# Patient Record
Sex: Female | Born: 1935 | Race: White | Hispanic: No | Smoking: Former smoker
Health system: Southern US, Community
[De-identification: ages and names within clinical notes are randomized; demographics above are authoritative.]

## PROBLEM LIST (undated history)

## (undated) DIAGNOSIS — T783XXA Angioneurotic edema, initial encounter: Secondary | ICD-10-CM

## (undated) DIAGNOSIS — M8580 Other specified disorders of bone density and structure, unspecified site: Secondary | ICD-10-CM

## (undated) DIAGNOSIS — I679 Cerebrovascular disease, unspecified: Secondary | ICD-10-CM

## (undated) DIAGNOSIS — I471 Supraventricular tachycardia: Secondary | ICD-10-CM

## (undated) DIAGNOSIS — M1990 Unspecified osteoarthritis, unspecified site: Secondary | ICD-10-CM

## (undated) DIAGNOSIS — I10 Essential (primary) hypertension: Secondary | ICD-10-CM

## (undated) DIAGNOSIS — F068 Other specified mental disorders due to known physiological condition: Secondary | ICD-10-CM

## (undated) DIAGNOSIS — R413 Other amnesia: Secondary | ICD-10-CM

## (undated) DIAGNOSIS — I4719 Other supraventricular tachycardia: Secondary | ICD-10-CM

## (undated) DIAGNOSIS — I639 Cerebral infarction, unspecified: Secondary | ICD-10-CM

## (undated) HISTORY — DX: Cerebral infarction, unspecified: I63.9

## (undated) HISTORY — DX: Angioneurotic edema, initial encounter: T78.3XXA

## (undated) HISTORY — DX: Other specified mental disorders due to known physiological condition: F06.8

## (undated) HISTORY — DX: Other supraventricular tachycardia: I47.19

## (undated) HISTORY — DX: Cerebrovascular disease, unspecified: I67.9

## (undated) HISTORY — DX: Other specified disorders of bone density and structure, unspecified site: M85.80

## (undated) HISTORY — DX: Essential (primary) hypertension: I10

## (undated) HISTORY — DX: Supraventricular tachycardia: I47.1

## (undated) HISTORY — DX: Unspecified osteoarthritis, unspecified site: M19.90

## (undated) HISTORY — DX: Other amnesia: R41.3

---

## 1960-02-02 HISTORY — PX: DILATION AND CURETTAGE OF UTERUS: SHX78

## 1961-02-01 HISTORY — PX: ABDOMINAL HYSTERECTOMY: SHX81

## 1984-02-02 HISTORY — PX: BUNIONECTOMY: SHX129

## 2002-09-25 ENCOUNTER — Other Ambulatory Visit: Admission: RE | Admit: 2002-09-25 | Discharge: 2002-09-25 | Payer: Self-pay | Admitting: Internal Medicine

## 2004-01-01 ENCOUNTER — Ambulatory Visit: Payer: Self-pay | Admitting: Internal Medicine

## 2004-04-06 ENCOUNTER — Ambulatory Visit: Payer: Self-pay | Admitting: Internal Medicine

## 2004-07-06 ENCOUNTER — Ambulatory Visit: Payer: Self-pay | Admitting: Internal Medicine

## 2004-07-17 ENCOUNTER — Ambulatory Visit: Payer: Self-pay

## 2004-11-10 ENCOUNTER — Ambulatory Visit: Payer: Self-pay | Admitting: Internal Medicine

## 2004-11-17 ENCOUNTER — Ambulatory Visit: Payer: Self-pay | Admitting: Internal Medicine

## 2004-11-18 ENCOUNTER — Ambulatory Visit: Payer: Self-pay | Admitting: Internal Medicine

## 2004-11-27 ENCOUNTER — Ambulatory Visit: Payer: Self-pay | Admitting: Family Medicine

## 2005-03-09 ENCOUNTER — Ambulatory Visit: Payer: Self-pay | Admitting: Internal Medicine

## 2005-03-17 ENCOUNTER — Ambulatory Visit: Payer: Self-pay

## 2005-03-19 ENCOUNTER — Ambulatory Visit: Payer: Self-pay | Admitting: Cardiovascular Disease

## 2005-03-24 ENCOUNTER — Ambulatory Visit: Payer: Self-pay | Admitting: Internal Medicine

## 2005-04-21 ENCOUNTER — Ambulatory Visit: Payer: Self-pay | Admitting: Internal Medicine

## 2005-07-21 ENCOUNTER — Ambulatory Visit: Payer: Self-pay | Admitting: Internal Medicine

## 2005-11-10 ENCOUNTER — Ambulatory Visit: Payer: Self-pay | Admitting: Internal Medicine

## 2005-11-10 ENCOUNTER — Encounter: Payer: Self-pay | Admitting: Internal Medicine

## 2005-11-10 DIAGNOSIS — M899 Disorder of bone, unspecified: Secondary | ICD-10-CM | POA: Insufficient documentation

## 2005-11-10 DIAGNOSIS — I10 Essential (primary) hypertension: Secondary | ICD-10-CM | POA: Insufficient documentation

## 2005-11-10 DIAGNOSIS — M949 Disorder of cartilage, unspecified: Secondary | ICD-10-CM

## 2005-11-24 ENCOUNTER — Encounter: Payer: Self-pay | Admitting: Cardiology

## 2005-11-24 ENCOUNTER — Ambulatory Visit: Payer: Self-pay

## 2005-12-03 ENCOUNTER — Ambulatory Visit: Payer: Self-pay | Admitting: Internal Medicine

## 2006-05-02 ENCOUNTER — Ambulatory Visit: Payer: Self-pay | Admitting: Internal Medicine

## 2006-05-10 ENCOUNTER — Ambulatory Visit: Payer: Self-pay | Admitting: Internal Medicine

## 2006-05-10 LAB — CONVERTED CEMR LAB
ALT: 21 units/L (ref 0–40)
AST: 24 units/L (ref 0–37)
Albumin: 3.8 g/dL (ref 3.5–5.2)
Alkaline Phosphatase: 60 units/L (ref 39–117)
BUN: 14 mg/dL (ref 6–23)
Basophils Absolute: 0.1 10*3/uL (ref 0.0–0.1)
Basophils Relative: 0.8 % (ref 0.0–1.0)
Bilirubin, Direct: 0.1 mg/dL (ref 0.0–0.3)
CO2: 33 meq/L — ABNORMAL HIGH (ref 19–32)
Calcium: 9.1 mg/dL (ref 8.4–10.5)
Chloride: 107 meq/L (ref 96–112)
Creatinine, Ser: 0.9 mg/dL (ref 0.4–1.2)
Eosinophils Absolute: 0.2 10*3/uL (ref 0.0–0.6)
Eosinophils Relative: 1.7 % (ref 0.0–5.0)
GFR calc Af Amer: 77 mL/min
GFR calc non Af Amer: 64 mL/min
Glucose, Bld: 102 mg/dL — ABNORMAL HIGH (ref 70–99)
HCT: 44.1 % (ref 36.0–46.0)
Hemoglobin: 15.1 g/dL — ABNORMAL HIGH (ref 12.0–15.0)
Lymphocytes Relative: 22.8 % (ref 12.0–46.0)
MCHC: 34.2 g/dL (ref 30.0–36.0)
MCV: 86.8 fL (ref 78.0–100.0)
Monocytes Absolute: 1.1 10*3/uL — ABNORMAL HIGH (ref 0.2–0.7)
Monocytes Relative: 12.5 % — ABNORMAL HIGH (ref 3.0–11.0)
Neutro Abs: 5.6 10*3/uL (ref 1.4–7.7)
Neutrophils Relative %: 62.2 % (ref 43.0–77.0)
Platelets: 218 10*3/uL (ref 150–400)
Potassium: 4.9 meq/L (ref 3.5–5.1)
RBC: 5.08 M/uL (ref 3.87–5.11)
RDW: 13.1 % (ref 11.5–14.6)
Sodium: 146 meq/L — ABNORMAL HIGH (ref 135–145)
TSH: 1.54 microintl units/mL (ref 0.35–5.50)
Total Bilirubin: 0.7 mg/dL (ref 0.3–1.2)
Total Protein: 6.8 g/dL (ref 6.0–8.3)
WBC: 9.1 10*3/uL (ref 4.5–10.5)

## 2006-08-11 DIAGNOSIS — I6529 Occlusion and stenosis of unspecified carotid artery: Secondary | ICD-10-CM

## 2006-10-11 ENCOUNTER — Ambulatory Visit: Payer: Self-pay | Admitting: Internal Medicine

## 2006-10-11 LAB — CONVERTED CEMR LAB: Rapid Strep: NEGATIVE

## 2006-10-31 ENCOUNTER — Ambulatory Visit: Payer: Self-pay | Admitting: Internal Medicine

## 2006-11-08 ENCOUNTER — Ambulatory Visit: Payer: Self-pay | Admitting: Internal Medicine

## 2006-11-08 LAB — CONVERTED CEMR LAB
ALT: 17 units/L (ref 0–35)
AST: 20 units/L (ref 0–37)
Albumin: 4 g/dL (ref 3.5–5.2)
Alkaline Phosphatase: 59 units/L (ref 39–117)
BUN: 14 mg/dL (ref 6–23)
Basophils Absolute: 0 10*3/uL (ref 0.0–0.1)
Basophils Relative: 0.1 % (ref 0.0–1.0)
Bilirubin Urine: NEGATIVE
Bilirubin, Direct: 0.2 mg/dL (ref 0.0–0.3)
CO2: 31 meq/L (ref 19–32)
Calcium: 9.2 mg/dL (ref 8.4–10.5)
Chloride: 104 meq/L (ref 96–112)
Cholesterol: 189 mg/dL (ref 0–200)
Creatinine, Ser: 0.9 mg/dL (ref 0.4–1.2)
Eosinophils Absolute: 0.3 10*3/uL (ref 0.0–0.6)
Eosinophils Relative: 3.7 % (ref 0.0–5.0)
GFR calc Af Amer: 77 mL/min
GFR calc non Af Amer: 64 mL/min
Glucose, Bld: 92 mg/dL (ref 70–99)
Glucose, Urine, Semiquant: NEGATIVE
HCT: 44.5 % (ref 36.0–46.0)
HDL: 64.7 mg/dL (ref >39.0)
Hemoglobin: 15.1 g/dL — ABNORMAL HIGH (ref 12.0–15.0)
Ketones, urine, test strip: NEGATIVE
LDL Cholesterol: 112 mg/dL — ABNORMAL HIGH (ref 0–99)
Lymphocytes Relative: 31 % (ref 12.0–46.0)
MCHC: 34 g/dL (ref 30.0–36.0)
MCV: 87.1 fL (ref 78.0–100.0)
Monocytes Absolute: 1 10*3/uL — ABNORMAL HIGH (ref 0.2–0.7)
Monocytes Relative: 12.2 % — ABNORMAL HIGH (ref 3.0–11.0)
Neutro Abs: 4.2 10*3/uL (ref 1.4–7.7)
Neutrophils Relative %: 53 % (ref 43.0–77.0)
Nitrite: NEGATIVE
Platelets: 219 10*3/uL (ref 150–400)
Potassium: 4.2 meq/L (ref 3.5–5.1)
RBC: 5.11 M/uL (ref 3.87–5.11)
RDW: 13.5 % (ref 11.5–14.6)
Sodium: 142 meq/L (ref 135–145)
Specific Gravity, Urine: 1.015
TSH: 1.5 microintl units/mL (ref 0.35–5.50)
Total Bilirubin: 1 mg/dL (ref 0.3–1.2)
Total CHOL/HDL Ratio: 2.9
Total Protein: 6.7 g/dL (ref 6.0–8.3)
Triglycerides: 61 mg/dL (ref 0–149)
Urobilinogen, UA: 0.2
VLDL: 12 mg/dL (ref 0–40)
WBC: 8 10*3/uL (ref 4.5–10.5)
pH: 7

## 2006-11-15 ENCOUNTER — Ambulatory Visit: Payer: Self-pay | Admitting: Internal Medicine

## 2006-11-22 ENCOUNTER — Telehealth: Payer: Self-pay | Admitting: Internal Medicine

## 2006-11-22 ENCOUNTER — Ambulatory Visit: Payer: Self-pay | Admitting: Internal Medicine

## 2006-12-26 ENCOUNTER — Ambulatory Visit: Payer: Self-pay | Admitting: Internal Medicine

## 2006-12-26 DIAGNOSIS — K589 Irritable bowel syndrome without diarrhea: Secondary | ICD-10-CM

## 2007-02-06 ENCOUNTER — Ambulatory Visit: Payer: Self-pay | Admitting: Internal Medicine

## 2007-02-07 ENCOUNTER — Telehealth: Payer: Self-pay | Admitting: Internal Medicine

## 2007-02-09 ENCOUNTER — Ambulatory Visit: Payer: Self-pay | Admitting: Internal Medicine

## 2007-02-20 ENCOUNTER — Ambulatory Visit: Payer: Self-pay | Admitting: Internal Medicine

## 2007-02-20 LAB — CONVERTED CEMR LAB
BUN: 8 mg/dL (ref 6–23)
Basophils Absolute: 0 10*3/uL (ref 0.0–0.1)
CO2: 29 meq/L (ref 19–32)
Chloride: 103 meq/L (ref 96–112)
Creatinine, Ser: 1.2 mg/dL (ref 0.4–1.2)
GFR calc Af Amer: 55 mL/min
Glucose, Bld: 112 mg/dL — ABNORMAL HIGH (ref 70–99)
HCT: 42.8 % (ref 36.0–46.0)
HDL: 31.4 mg/dL — ABNORMAL LOW (ref >39.0)
Hemoglobin: 14.6 g/dL (ref 12.0–15.0)
Lymphocytes Relative: 8.8 % — ABNORMAL LOW (ref 12.0–46.0)
Monocytes Absolute: 1.3 10*3/uL — ABNORMAL HIGH (ref 0.2–0.7)
Neutro Abs: 9.9 10*3/uL — ABNORMAL HIGH (ref 1.4–7.7)
Neutrophils Relative %: 79.9 % — ABNORMAL HIGH (ref 43.0–77.0)
Platelets: 313 10*3/uL (ref 150–400)
RBC: 4.95 M/uL (ref 3.87–5.11)
Sodium: 140 meq/L (ref 135–145)
Total CHOL/HDL Ratio: 4.6
Triglycerides: 66 mg/dL (ref 0–149)

## 2007-03-06 ENCOUNTER — Ambulatory Visit: Payer: Self-pay | Admitting: Internal Medicine

## 2007-03-21 ENCOUNTER — Ambulatory Visit: Payer: Self-pay | Admitting: Internal Medicine

## 2007-05-25 ENCOUNTER — Encounter: Payer: Self-pay | Admitting: Internal Medicine

## 2007-05-25 ENCOUNTER — Ambulatory Visit: Payer: Self-pay | Admitting: Internal Medicine

## 2007-05-25 LAB — CONVERTED CEMR LAB
ALT: 16 units/L (ref 0–35)
AST: 20 units/L (ref 0–37)
Albumin: 4 g/dL (ref 3.5–5.2)
Alkaline Phosphatase: 62 units/L (ref 39–117)
Basophils Relative: 0 % (ref 0.0–1.0)
Bilirubin, Direct: 0.1 mg/dL (ref 0.0–0.3)
Eosinophils Relative: 1.3 % (ref 0.0–5.0)
GFR calc non Af Amer: 57 mL/min
Glucose, Bld: 102 mg/dL — ABNORMAL HIGH (ref 70–99)
Hemoglobin: 15.5 g/dL — ABNORMAL HIGH (ref 12.0–15.0)
MCHC: 33.2 g/dL (ref 30.0–36.0)
MCV: 87.5 fL (ref 78.0–100.0)
Monocytes Absolute: 0.9 10*3/uL (ref 0.1–1.0)
Neutro Abs: 6.3 10*3/uL (ref 1.4–7.7)
Neutrophils Relative %: 71.7 % (ref 43.0–77.0)
Platelets: 198 10*3/uL (ref 150–400)
RBC: 5.34 M/uL — ABNORMAL HIGH (ref 3.87–5.11)
RDW: 14.1 % (ref 11.5–14.6)
Total Bilirubin: 1 mg/dL (ref 0.3–1.2)
Total CHOL/HDL Ratio: 3.1
Total Protein: 7.2 g/dL (ref 6.0–8.3)
Triglycerides: 83 mg/dL (ref 0–149)
WBC: 8.7 10*3/uL (ref 4.5–10.5)

## 2007-06-05 ENCOUNTER — Telehealth: Payer: Self-pay | Admitting: Internal Medicine

## 2007-06-06 ENCOUNTER — Ambulatory Visit: Payer: Self-pay | Admitting: Internal Medicine

## 2007-07-12 ENCOUNTER — Ambulatory Visit: Payer: Self-pay | Admitting: Internal Medicine

## 2007-07-21 ENCOUNTER — Ambulatory Visit: Payer: Self-pay | Admitting: Internal Medicine

## 2007-07-21 DIAGNOSIS — T783XXA Angioneurotic edema, initial encounter: Secondary | ICD-10-CM | POA: Insufficient documentation

## 2007-07-24 ENCOUNTER — Ambulatory Visit: Payer: Self-pay | Admitting: Internal Medicine

## 2007-07-28 ENCOUNTER — Ambulatory Visit: Payer: Self-pay | Admitting: Internal Medicine

## 2007-09-20 ENCOUNTER — Telehealth: Payer: Self-pay | Admitting: Internal Medicine

## 2007-09-22 ENCOUNTER — Ambulatory Visit: Payer: Self-pay | Admitting: Internal Medicine

## 2007-09-25 LAB — CONVERTED CEMR LAB
Basophils Absolute: 0 10*3/uL (ref 0.0–0.1)
Basophils Relative: 0 % (ref 0.0–3.0)
Eosinophils Absolute: 0.1 10*3/uL (ref 0.0–0.7)
Eosinophils Relative: 0.9 % (ref 0.0–5.0)
Lymphocytes Relative: 15.4 % (ref 12.0–46.0)
MCHC: 34.1 g/dL (ref 30.0–36.0)
Monocytes Absolute: 0.8 10*3/uL (ref 0.1–1.0)
Neutro Abs: 5.5 10*3/uL (ref 1.4–7.7)
WBC: 7.5 10*3/uL (ref 4.5–10.5)

## 2007-09-27 ENCOUNTER — Telehealth (INDEPENDENT_AMBULATORY_CARE_PROVIDER_SITE_OTHER): Payer: Self-pay | Admitting: *Deleted

## 2007-10-24 ENCOUNTER — Ambulatory Visit: Payer: Self-pay | Admitting: Gastroenterology

## 2007-10-25 ENCOUNTER — Encounter: Payer: Self-pay | Admitting: Gastroenterology

## 2007-10-25 ENCOUNTER — Ambulatory Visit: Payer: Self-pay | Admitting: Gastroenterology

## 2007-10-25 LAB — HM COLONOSCOPY

## 2007-10-27 ENCOUNTER — Encounter: Payer: Self-pay | Admitting: Gastroenterology

## 2007-11-08 ENCOUNTER — Ambulatory Visit (HOSPITAL_COMMUNITY): Admission: RE | Admit: 2007-11-08 | Discharge: 2007-11-08 | Payer: Self-pay | Admitting: Gastroenterology

## 2007-11-09 ENCOUNTER — Ambulatory Visit: Payer: Self-pay | Admitting: Internal Medicine

## 2007-12-05 ENCOUNTER — Ambulatory Visit: Payer: Self-pay | Admitting: Internal Medicine

## 2008-01-03 ENCOUNTER — Ambulatory Visit: Payer: Self-pay | Admitting: Internal Medicine

## 2008-01-03 LAB — CONVERTED CEMR LAB
BUN: 17 mg/dL (ref 6–23)
CO2: 29 meq/L (ref 19–32)
Calcium: 9.3 mg/dL (ref 8.4–10.5)
Chloride: 105 meq/L (ref 96–112)
Creatinine, Ser: 0.9 mg/dL (ref 0.4–1.2)
GFR calc Af Amer: 77 mL/min
GFR calc non Af Amer: 64 mL/min
Glucose, Bld: 98 mg/dL (ref 70–99)
Potassium: 3.6 meq/L (ref 3.5–5.1)
Sodium: 143 meq/L (ref 135–145)

## 2008-05-21 ENCOUNTER — Ambulatory Visit: Payer: Self-pay | Admitting: Internal Medicine

## 2008-05-29 ENCOUNTER — Telehealth (INDEPENDENT_AMBULATORY_CARE_PROVIDER_SITE_OTHER): Payer: Self-pay | Admitting: *Deleted

## 2008-06-18 ENCOUNTER — Ambulatory Visit: Payer: Self-pay | Admitting: Internal Medicine

## 2008-06-24 ENCOUNTER — Ambulatory Visit: Payer: Self-pay | Admitting: Internal Medicine

## 2008-06-27 ENCOUNTER — Telehealth: Payer: Self-pay | Admitting: Internal Medicine

## 2008-08-04 ENCOUNTER — Emergency Department (HOSPITAL_COMMUNITY): Admission: EM | Admit: 2008-08-04 | Discharge: 2008-08-04 | Payer: Self-pay | Admitting: Emergency Medicine

## 2008-08-06 ENCOUNTER — Ambulatory Visit: Payer: Self-pay | Admitting: Internal Medicine

## 2008-08-20 ENCOUNTER — Ambulatory Visit: Payer: Self-pay | Admitting: Internal Medicine

## 2008-08-23 ENCOUNTER — Ambulatory Visit: Payer: Self-pay | Admitting: Internal Medicine

## 2008-08-26 ENCOUNTER — Ambulatory Visit: Payer: Self-pay | Admitting: Internal Medicine

## 2008-09-10 ENCOUNTER — Ambulatory Visit: Payer: Self-pay | Admitting: Internal Medicine

## 2008-09-12 ENCOUNTER — Ambulatory Visit: Payer: Self-pay | Admitting: Internal Medicine

## 2008-09-13 ENCOUNTER — Encounter: Payer: Self-pay | Admitting: Internal Medicine

## 2008-09-13 LAB — CONVERTED CEMR LAB
AST: 18 units/L (ref 0–37)
Albumin: 3.9 g/dL (ref 3.5–5.2)
Basophils Absolute: 0 10*3/uL (ref 0.0–0.1)
Basophils Relative: 0 % (ref 0.0–3.0)
Eosinophils Relative: 0.3 % (ref 0.0–5.0)
Hemoglobin: 14.7 g/dL (ref 12.0–15.0)
Lymphocytes Relative: 11.1 % — ABNORMAL LOW (ref 12.0–46.0)
Monocytes Absolute: 1.1 10*3/uL — ABNORMAL HIGH (ref 0.1–1.0)
Monocytes Relative: 9.7 % (ref 3.0–12.0)
Neutro Abs: 8.7 10*3/uL — ABNORMAL HIGH (ref 1.4–7.7)
Neutrophils Relative %: 78.9 % — ABNORMAL HIGH (ref 43.0–77.0)
RDW: 13.1 % (ref 11.5–14.6)
Total Bilirubin: 1 mg/dL (ref 0.3–1.2)

## 2008-09-16 ENCOUNTER — Telehealth: Payer: Self-pay | Admitting: Internal Medicine

## 2008-09-26 ENCOUNTER — Ambulatory Visit: Payer: Self-pay | Admitting: Internal Medicine

## 2008-10-11 ENCOUNTER — Telehealth: Payer: Self-pay | Admitting: Internal Medicine

## 2008-10-14 ENCOUNTER — Encounter (INDEPENDENT_AMBULATORY_CARE_PROVIDER_SITE_OTHER): Payer: Self-pay | Admitting: *Deleted

## 2008-10-30 ENCOUNTER — Ambulatory Visit: Payer: Self-pay | Admitting: Internal Medicine

## 2008-11-12 ENCOUNTER — Observation Stay (HOSPITAL_COMMUNITY): Admission: EM | Admit: 2008-11-12 | Discharge: 2008-11-13 | Payer: Self-pay | Admitting: Emergency Medicine

## 2008-11-19 ENCOUNTER — Ambulatory Visit: Payer: Self-pay | Admitting: Internal Medicine

## 2008-11-25 ENCOUNTER — Telehealth: Payer: Self-pay | Admitting: Internal Medicine

## 2008-12-04 ENCOUNTER — Ambulatory Visit: Payer: Self-pay | Admitting: Internal Medicine

## 2008-12-30 ENCOUNTER — Telehealth: Payer: Self-pay | Admitting: Internal Medicine

## 2009-01-08 ENCOUNTER — Ambulatory Visit: Payer: Self-pay | Admitting: Internal Medicine

## 2009-02-19 ENCOUNTER — Ambulatory Visit: Payer: Self-pay | Admitting: Internal Medicine

## 2009-02-27 ENCOUNTER — Ambulatory Visit: Payer: Self-pay | Admitting: Internal Medicine

## 2009-03-19 ENCOUNTER — Ambulatory Visit: Payer: Self-pay | Admitting: Internal Medicine

## 2009-04-01 ENCOUNTER — Telehealth: Payer: Self-pay | Admitting: Internal Medicine

## 2009-04-02 ENCOUNTER — Ambulatory Visit: Payer: Self-pay | Admitting: Cardiology

## 2009-04-02 ENCOUNTER — Ambulatory Visit: Payer: Self-pay | Admitting: Internal Medicine

## 2009-04-02 DIAGNOSIS — I6350 Cerebral infarction due to unspecified occlusion or stenosis of unspecified cerebral artery: Secondary | ICD-10-CM | POA: Insufficient documentation

## 2009-04-03 ENCOUNTER — Telehealth: Payer: Self-pay | Admitting: Internal Medicine

## 2009-04-04 LAB — CONVERTED CEMR LAB
ALT: 15 units/L (ref 0–35)
Albumin: 3.8 g/dL (ref 3.5–5.2)
BUN: 11 mg/dL (ref 6–23)
Basophils Relative: 0.1 % (ref 0.0–3.0)
Bilirubin, Direct: 0 mg/dL (ref 0.0–0.3)
CO2: 33 meq/L — ABNORMAL HIGH (ref 19–32)
Calcium: 8.8 mg/dL (ref 8.4–10.5)
Chloride: 104 meq/L (ref 96–112)
Cholesterol: 163 mg/dL (ref 0–200)
Eosinophils Absolute: 0.1 10*3/uL (ref 0.0–0.7)
Eosinophils Relative: 0.8 % (ref 0.0–5.0)
Glucose, Bld: 94 mg/dL (ref 70–99)
HCT: 42.7 % (ref 36.0–46.0)
HDL: 57.9 mg/dL (ref >39.00)
Lymphocytes Relative: 15.6 % (ref 12.0–46.0)
Monocytes Absolute: 0.9 10*3/uL (ref 0.1–1.0)
Neutro Abs: 6.2 10*3/uL (ref 1.4–7.7)
Neutrophils Relative %: 72.6 % (ref 43.0–77.0)
RBC: 4.75 M/uL (ref 3.87–5.11)
RDW: 13.4 % (ref 11.5–14.6)
Total CHOL/HDL Ratio: 3
Triglycerides: 102 mg/dL (ref 0.0–149.0)
WBC: 8.5 10*3/uL (ref 4.5–10.5)

## 2009-04-07 ENCOUNTER — Ambulatory Visit: Payer: Self-pay | Admitting: Internal Medicine

## 2009-04-08 ENCOUNTER — Encounter: Admission: RE | Admit: 2009-04-08 | Discharge: 2009-04-08 | Payer: Self-pay | Admitting: Internal Medicine

## 2009-04-08 ENCOUNTER — Telehealth: Payer: Self-pay | Admitting: Internal Medicine

## 2009-04-11 ENCOUNTER — Encounter: Admission: RE | Admit: 2009-04-11 | Discharge: 2009-04-14 | Payer: Self-pay | Admitting: Internal Medicine

## 2009-04-18 ENCOUNTER — Ambulatory Visit: Payer: Self-pay | Admitting: Internal Medicine

## 2009-04-18 ENCOUNTER — Telehealth: Payer: Self-pay | Admitting: Internal Medicine

## 2009-04-23 ENCOUNTER — Ambulatory Visit: Payer: Self-pay | Admitting: Family Medicine

## 2009-04-24 ENCOUNTER — Telehealth: Payer: Self-pay | Admitting: *Deleted

## 2009-04-25 ENCOUNTER — Encounter: Payer: Self-pay | Admitting: Internal Medicine

## 2009-04-29 ENCOUNTER — Ambulatory Visit: Payer: Self-pay | Admitting: Internal Medicine

## 2009-05-07 ENCOUNTER — Ambulatory Visit: Payer: Self-pay | Admitting: Family Medicine

## 2009-05-07 DIAGNOSIS — F411 Generalized anxiety disorder: Secondary | ICD-10-CM | POA: Insufficient documentation

## 2009-05-12 ENCOUNTER — Ambulatory Visit: Payer: Self-pay | Admitting: Internal Medicine

## 2009-06-10 ENCOUNTER — Ambulatory Visit: Payer: Self-pay | Admitting: Internal Medicine

## 2009-06-11 ENCOUNTER — Telehealth: Payer: Self-pay | Admitting: Internal Medicine

## 2009-06-17 ENCOUNTER — Telehealth: Payer: Self-pay | Admitting: Internal Medicine

## 2009-06-23 ENCOUNTER — Ambulatory Visit: Payer: Self-pay | Admitting: Internal Medicine

## 2009-08-13 ENCOUNTER — Ambulatory Visit: Payer: Self-pay | Admitting: Internal Medicine

## 2009-08-27 ENCOUNTER — Ambulatory Visit: Payer: Self-pay | Admitting: Internal Medicine

## 2009-10-01 ENCOUNTER — Ambulatory Visit: Payer: Self-pay | Admitting: Internal Medicine

## 2009-11-10 ENCOUNTER — Ambulatory Visit: Payer: Self-pay | Admitting: Internal Medicine

## 2009-11-19 ENCOUNTER — Ambulatory Visit: Payer: Self-pay | Admitting: Internal Medicine

## 2009-12-24 ENCOUNTER — Ambulatory Visit: Payer: Self-pay | Admitting: Internal Medicine

## 2010-01-27 ENCOUNTER — Ambulatory Visit: Payer: Self-pay | Admitting: Internal Medicine

## 2010-03-03 ENCOUNTER — Ambulatory Visit
Admission: RE | Admit: 2010-03-03 | Discharge: 2010-03-03 | Payer: Self-pay | Source: Home / Self Care | Attending: Internal Medicine | Admitting: Internal Medicine

## 2010-03-03 NOTE — Assessment & Plan Note (Signed)
Summary: R EAR CONGESTED? // RS   Vital Signs:  Patient profile:   77 year old female Temp:     98.2 degrees F oral Pulse rate:   68 / minute Resp:     12 per minute BP sitting:   162 / 78  (left arm) Cuff size:   regular  Vitals Entered By: Gladis Riffle, RN (March 19, 2009 11:27 AM)  Serial Vital Signs/Assessments:  Time      Position  BP       Pulse  Resp  Temp     By                     142/80                         Birdie Sons MD  CC: c/o right ear clogged x 1 week, resolved and then got worse Is Patient Diabetic? No   Primary Care Provider:  Birdie Sons, M.D.  CC:  c/o right ear clogged x 1 week and resolved and then got worse.  History of Present Illness: right ear feels clogged---a pressure sensation no pain duration: 1 week no trauma decreased hearing  HTN---tolerating meds angioedema--no recurrence  All other systems reviewed and were negative   Preventive Screening-Counseling & Management  Alcohol-Tobacco     Smoking Status: quit > 6 months     Year Started: 1952     Year Quit: 1954  Medications Prior to Update: 1)  Aspir-Low 81 Mg Tbec (Aspirin) .... One By Mouth Daily 2)  Tums 500 Mg Chew (Calcium Carbonate Antacid) .... One Once Daily 3)  Lomotil 2.5-0.025 Mg Tabs (Diphenoxylate-Atropine) .... Once Daily As Needed 4)  Centrum Silver   Tabs (Multiple Vitamins-Minerals) .... Once Daily 5)  Benadryl 25 Mg  Caps (Diphenhydramine Hcl) .... Take 1 Tablet By Mouth At Bedtime--If Off Xyzal Can Take Every 4-6 Hours As Needed 6)  Clonidine Hcl 0.2 Mg Tabs (Clonidine Hcl) .... 1/2 By Mouth Three Times A Day 7)  Triamcinolone Acetonide 0.1 % Crea (Triamcinolone Acetonide) .... Use Twice Daily As Needed For Itching 8)  Xyzal 5 Mg Tabs (Levocetirizine Dihydrochloride) .... 1/2-1 Evry Hs As Needed 9)  Epipen 2-Pak 0.3 Mg/0.6ml Devi (Epinephrine) .... Use As Directed As Needed 10)  Hydrochlorothiazide 25 Mg  Tabs (Hydrochlorothiazide) .... Take 1/2 Tab By  Mouth Every Morning As Needed 11)  Diltiazem Hcl Cr 180 Mg Cp24 (Diltiazem Hcl) .... Take 1  Tablet By Mouth Two Times A Day 12)  Avapro 150 Mg Tabs (Irbesartan) .... Take 1 Tablet By Mouth Two Times A Day  Allergies: 1)  ! Ace Inhibitors 2)  ! Felodipine (Felodipine) 3)  ! Famotidine (Famotidine) 4)  ! Fexofenadine Hcl (Fexofenadine Hcl) 5)  ! Metoprolol Tartrate (Metoprolol Tartrate) 6)  Penicillin V Potassium (Penicillin V Potassium) 7)  Pneumovax 23 (Pneumococcal Vac Polyvalent) 8)  Prinivil (Lisinopril)  Past History:  Past Medical History: Last updated: 10/24/2007 Hypertension Osteopenia Carotid vascular disease (stenosis) angioedema-ACE inhibitor allergic reaction to pneumovax 10/03 Obesity Arthritis  Past Surgical History: Last updated: 10/20/2007 Hysterectomy (no ca) Oophorectomy-unilateral-1963 Bunionectomy-1986 D&C-1962 [id number]  Family History: Last updated: 10/24/2007 Family History of Heart Disease: Father, Mother No FH of Colon Cancer:  Social History: Last updated: 10/24/2007 Retired Married Former Smoker Alcohol Use - yes Daily Caffeine Use  Risk Factors: Smoking Status: quit > 6 months (03/19/2009)  Review of Systems       All  other systems reviewed and were negative   Physical Exam  General:  Well-developed,well-nourished,in no acute distress; alert,appropriate and cooperative throughout examination Head:  normocephalic and atraumatic.   Eyes:  pupils equal and pupils round.   Ears:  R ear normal and L ear normal.  left ear canal is tender to palpation. Neck:  No deformities, masses, or tenderness noted. Chest Wall:  Symmetrical;  no deformities or tenderness. Lungs:  Normal respiratory effort, chest expands symmetrically. Lungs are clear to auscultation, no crackles or wheezes. Heart:  normal rate and regular rhythm.     Impression & Recommendations:  Problem # 1:  OTITIS EXTERNA, UNSPEC. (ICD-380.10)  i suspect bilateral has  some eczematous changes both ears trial cortisporin otic side effects discussed.  Problem # 2:  HYPERTENSION (ICD-401.9)  continue current medications  Her updated medication list for this problem includes:    Clonidine Hcl 0.2 Mg Tabs (Clonidine hcl) .Marland Kitchen... 1/2 by mouth three times a day    Hydrochlorothiazide 25 Mg Tabs (Hydrochlorothiazide) .Marland Kitchen... Take 1/2 tab by mouth every morning as needed    Diltiazem Hcl Cr 180 Mg Cp24 (Diltiazem hcl) .Marland Kitchen... Take 1  tablet by mouth two times a day    Avapro 150 Mg Tabs (Irbesartan) .Marland Kitchen... Take 1 tablet by mouth two times a day  BP today: 162/78 Prior BP: 168/76 (02/27/2009)  Labs Reviewed: K+: 3.6 (01/03/2008) Creat: : 0.9 (01/03/2008)   Chol: 177 (05/25/2007)   HDL: 57.8 (05/25/2007)   LDL: 103 (05/25/2007)   TG: 83 (05/25/2007)  Problem # 3:  ANGIOEDEMA (ICD-995.1) no recurrence.  Complete Medication List: 1)  Aspir-low 81 Mg Tbec (Aspirin) .... One by mouth daily 2)  Tums 500 Mg Chew (Calcium carbonate antacid) .... One once daily 3)  Lomotil 2.5-0.025 Mg Tabs (Diphenoxylate-atropine) .... Once daily as needed 4)  Centrum Silver Tabs (Multiple vitamins-minerals) .... Once daily 5)  Benadryl 25 Mg Caps (Diphenhydramine hcl) .... Take 1 tablet by mouth at bedtime--if off xyzal can take every 4-6 hours as needed 6)  Clonidine Hcl 0.2 Mg Tabs (Clonidine hcl) .... 1/2 by mouth three times a day 7)  Triamcinolone Acetonide 0.1 % Crea (Triamcinolone acetonide) .... Use twice daily as needed for itching 8)  Xyzal 5 Mg Tabs (Levocetirizine dihydrochloride) .... 1/2-1 evry hs as needed 9)  Epipen 2-pak 0.3 Mg/0.85ml Devi (Epinephrine) .... Use as directed as needed 10)  Hydrochlorothiazide 25 Mg Tabs (Hydrochlorothiazide) .... Take 1/2 tab by mouth every morning as needed 11)  Diltiazem Hcl Cr 180 Mg Cp24 (Diltiazem hcl) .... Take 1  tablet by mouth two times a day 12)  Avapro 150 Mg Tabs (Irbesartan) .... Take 1 tablet by mouth two times a  day  Patient Instructions: 1)  see me 3-4 weeks 2)  cancel other appts Prescriptions: NEOMYCIN-POLYMYXIN-HC 3.5-10000-1 SOLN (NEOMYCIN-POLYMYXIN-HC) 2 drops each ear three times a day for 7 days  #1 vial x 0   Entered and Authorized by:   Birdie Sons MD   Signed by:   Birdie Sons MD on 03/19/2009   Method used:   Electronically to        [facility]* (retail)       [address]       [place], [place]  [address]       Ph: [phone]       Fax: [phone]   RxID:   [id number]

## 2010-03-03 NOTE — Miscellaneous (Signed)
Summary: Initial Summary for OT Services/[place]  Initial Summary for OT Services/   Imported By: Maryln Gottron 04/29/2009 13:21:49  _____________________________________________________________________  External Attachment:    Type:   Image     Comment:   External Document

## 2010-03-03 NOTE — Progress Notes (Signed)
Summary: REFILL  Phone Note Refill Request Message from:  Fax from Pharmacy  Refills Requested: Medication #1:  HYDROCHLOROTHIAZIDE 25 MG  TABS Take 1/2 tab by mouth every morning as needed [facility]---[phone]    FAX---[phone]  Initial call taken by: Warnell Forester,  April 24, 2009 12:45 PM    Prescriptions: HYDROCHLOROTHIAZIDE 25 MG  TABS (HYDROCHLOROTHIAZIDE) Take 1/2 tab by mouth every morning as needed  #30 x 3   Entered by:   Kern Reap CMA (AAMA)   Authorized by:   Birdie Sons MD   Signed by:   Kern Reap CMA (AAMA) on 04/24/2009   Method used:   Electronically to        [facility]* (retail)       [address]       [place], [place]  [id number]       Ph: [phone]       Fax: [phone]   RxID:   [id number]

## 2010-03-03 NOTE — Assessment & Plan Note (Signed)
Summary: DIARRHEA // RS   Vital Signs:  Patient profile:   77 year old female Weight:      149 pounds Temp:     98.7 degrees F oral Pulse rate:   50 / minute Pulse rhythm:   regular Resp:     12 per minute BP sitting:   164 / 76  (left arm)  Vitals Entered By: Gladis Riffle, RN (May 07, 2009 11:25 AM) CC: diarrhea x 1 1/2 months, resolved with lomotil--c/o feeling of "insides are going to come out" Is Patient Diabetic? No   History of Present Illness: Here with her husband for 3 months of symptoms including diarrhea, abdominal bloating, heart racing, mild SOB, and shakiness. She admits to being a nervous person, and she worries about things. Her husband thinks she worries too much. No chest pain.   Preventive Screening-Counseling & Management  Alcohol-Tobacco     Smoking Status: never     Year Started: 1950     Year Quit: 1954  Current Medications (verified): 1)  Aggrenox 25-200 Mg  Cp12 (Aspirin-Dipyridamole) .... Take 1 Capsule By Mouth Twice A Day 2)  Tums 500 Mg Chew (Calcium Carbonate Antacid) .... One Once Daily 3)  Lomotil 2.5-0.025 Mg Tabs (Diphenoxylate-Atropine) .... Once Daily As Needed 4)  Centrum Silver   Tabs (Multiple Vitamins-Minerals) .... Once Daily 5)  Benadryl 25 Mg  Caps (Diphenhydramine Hcl) .... Take 1 Tablet By Mouth At Bedtime--If Off Xyzal Can Take Every 4-6 Hours As Needed 6)  Clonidine Hcl 0.2 Mg Tabs (Clonidine Hcl) .... 1/2 Three Times Daily. 7)  Epipen 2-Pak 0.3 Mg/0.33ml Devi (Epinephrine) .... Use As Directed As Needed 8)  Hydrochlorothiazide 25 Mg  Tabs (Hydrochlorothiazide) .... Take 1/2 Tab By Mouth Every Morning As Needed 9)  Diltiazem Hcl Cr 180 Mg Cp24 (Diltiazem Hcl) .... Take 1  Tablet By Mouth Two Times A Day 10)  Avapro 150 Mg Tabs (Irbesartan) .... Take 1 Tablet By Mouth Two Times A Day 11)  Bystolic 10 Mg  Tabs (Nebivolol Hcl) .Marland Kitchen.. 1 By Mouth Daily  Allergies: 1)  ! Ace Inhibitors 2)  ! Felodipine (Felodipine) 3)  ! Famotidine  (Famotidine) 4)  ! Fexofenadine Hcl (Fexofenadine Hcl) 5)  ! Metoprolol Tartrate (Metoprolol Tartrate) 6)  Penicillin V Potassium (Penicillin V Potassium) 7)  Pneumovax 23 (Pneumococcal Vac Polyvalent) 8)  Prinivil (Lisinopril)  Past History:  Past Medical History: Reviewed history from 04/18/2009 and no changes required. Hypertension Osteopenia Carotid vascular disease (stenosis) angioedema-ACE inhibitor allergic reaction to pneumovax 10/03 Obesity Arthritis  cerebrovascular disease with right brain stroke/left hemiparesis  Past Surgical History: Reviewed history from 10/20/2007 and no changes required. Hysterectomy (no ca) Oophorectomy-unilateral-1963 Bunionectomy-1986 D&C-1962 [id number]  Review of Systems  The patient denies anorexia, fever, weight loss, weight gain, vision loss, decreased hearing, hoarseness, chest pain, syncope, peripheral edema, prolonged cough, headaches, hemoptysis, melena, hematochezia, severe indigestion/heartburn, hematuria, incontinence, genital sores, muscle weakness, suspicious skin lesions, transient blindness, difficulty walking, depression, unusual weight change, abnormal bleeding, enlarged lymph nodes, angioedema, breast masses, and testicular masses.    Physical Exam  General:  Well-developed,well-nourished,in no acute distress; alert,appropriate and cooperative throughout examination Neck:  No deformities, masses, or tenderness noted. Lungs:  Normal respiratory effort, chest expands symmetrically. Lungs are clear to auscultation, no crackles or wheezes. Heart:  Normal rate and regular rhythm. S1 and S2 normal without gallop, murmur, click, rub or other extra sounds. Abdomen:  Bowel sounds positive,abdomen soft and non-tender without masses, organomegaly or hernias noted.  Psych:  Oriented X3, memory intact for recent and remote, normally interactive, good eye contact, and moderately anxious.     Impression & Recommendations:  Problem # 1:   ANXIETY STATE, UNSPECIFIED (ICD-300.00)  Her updated medication list for this problem includes:    Ativan 0.5 Mg Tabs (Lorazepam) .Marland Kitchen... 1 q 6 hours as needed anxiety  Complete Medication List: 1)  Aggrenox 25-200 Mg Cp12 (Aspirin-dipyridamole) .... Take 1 capsule by mouth twice a day 2)  Tums 500 Mg Chew (Calcium carbonate antacid) .... One once daily 3)  Lomotil 2.5-0.025 Mg Tabs (Diphenoxylate-atropine) .... Once daily as needed 4)  Centrum Silver Tabs (Multiple vitamins-minerals) .... Once daily 5)  Benadryl 25 Mg Caps (Diphenhydramine hcl) .... Take 1 tablet by mouth at bedtime--if off xyzal can take every 4-6 hours as needed 6)  Clonidine Hcl 0.2 Mg Tabs (Clonidine hcl) .... 1/2 three times daily. 7)  Epipen 2-pak 0.3 Mg/0.89ml Devi (Epinephrine) .... Use as directed as needed 8)  Hydrochlorothiazide 25 Mg Tabs (Hydrochlorothiazide) .... Take 1/2 tab by mouth every morning as needed 9)  Diltiazem Hcl Cr 180 Mg Cp24 (Diltiazem hcl) .... Take 1  tablet by mouth two times a day 10)  Avapro 150 Mg Tabs (Irbesartan) .... Take 1 tablet by mouth two times a day 11)  Bystolic 10 Mg Tabs (Nebivolol hcl) .Marland Kitchen.. 1 by mouth daily 12)  Ativan 0.5 Mg Tabs (Lorazepam) .Marland Kitchen.. 1 q 6 hours as needed anxiety  Patient Instructions: 1)  I think most all of her symptoms stem from her anxiety, and they both agreed. Try Lorazepam on an as needed basis. She will follow up with Dr. Cato Mulligan next week. Prescriptions: ATIVAN 0.5 MG TABS (LORAZEPAM) 1 q 6 hours as needed anxiety  #60 x 2   Entered and Authorized by:   Nelwyn Salisbury MD   Signed by:   Nelwyn Salisbury MD on 05/07/2009   Method used:   Print then Give to Patient   RxID:   [id number]

## 2010-03-03 NOTE — Assessment & Plan Note (Signed)
Summary: FU ON MED/DIZZINESS/NJR   Vital Signs:  Patient profile:   77 year old female Weight:      150 pounds Temp:     98.6 degrees F oral Pulse rate:   48 / minute Pulse rhythm:   irregular Resp:     12 per minute BP sitting:   146 / 58  (left arm) Cuff size:   regular  Vitals Entered By: Gladis Riffle, RN (Jun 23, 2009 11:02 AM) CC: FU meds--states aggrenox causing blurred vision and dizziness, is back to one bid but has not taken this AM Is Patient Diabetic? No Comments c/o feeling slow and not herself BP 111/56-208/70 at home   Primary Care Provider:  Birdie Sons, M.D.  CC:  FU meds--states aggrenox causing blurred vision and dizziness and is back to one bid but has not taken this AM.  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 Ace Inhibitors 2)  ! Felodipine (Felodipine) 3)  ! Famotidine (Famotidine) 4)  ! Fexofenadine Hcl (Fexofenadine Hcl) 5)  ! Metoprolol Tartrate (Metoprolol Tartrate) 6)  Penicillin V Potassium (Penicillin V Potassium) 7)  Pneumovax 23 (Pneumococcal Vac Polyvalent) 8)  Prinivil (Lisinopril)  Past History:  Past Medical History: Last updated: 04/18/2009 Hypertension Osteopenia Carotid vascular disease (stenosis) angioedema-ACE inhibitor allergic reaction to  pneumovax 10/03 Obesity Arthritis  cerebrovascular disease with right brain stroke/left hemiparesis  Past Surgical History: Last updated: 10/20/2007 Hysterectomy (no ca) Oophorectomy-unilateral-1963 Bunionectomy-1986 D&C-1962 [id number]  Family History: Last updated: 10/24/2007 Family History of Heart Disease: Father, Mother No FH of Colon Cancer:  Social History: Last updated: 10/24/2007 Retired Married Former Smoker Alcohol Use - yes Daily Caffeine Use  Risk Factors: Smoking Status: never (06/23/2009)  Physical Exam  General:  alert and well-developed.   Head:  normocephalic and atraumatic.   Eyes:  pupils equal and pupils round.   Ears:  R ear normal and L ear normal.   Neck:  No deformities, masses, or tenderness noted. Chest Wall:  Symmetrical;  no deformities or tenderness. Lungs:  normal respiratory effort and no intercostal retractions.   Heart:  normal rate and regular rhythm.   Abdomen:  soft and non-tender.   Skin:  turgor normal and color normal.     Impression & Recommendations:  Problem # 1:  STROKE (ICD-434.91) dificulty tolerating aggrenox will d/c start asa 325 qd The following medications were removed from the medication list:    Aggrenox 25-200 Mg Cp12 (Aspirin-dipyridamole) .Marland Kitchen... Take 1 capsule by mouth twice a day Her updated medication list for this problem includes:    Aspirin 325 Mg Tabs (Aspirin) .Marland Kitchen... Take 1 tab by mouth every day  Complete Medication List: 1)  Tums 500 Mg Chew (Calcium carbonate antacid) .... One once daily 2)  Lomotil 2.5-0.025 Mg Tabs (Diphenoxylate-atropine) .... Once  daily as needed 3)  Centrum Silver Tabs (Multiple vitamins-minerals) .... Once daily 4)  Benadryl 25 Mg Caps (Diphenhydramine hcl) .... Take 1 tablet by mouth at bedtime--if off xyzal can take every 4-6 hours as needed 5)  Clonidine Hcl 0.2 Mg Tabs (Clonidine hcl) .... 1/2 three times daily. 6)  Epipen 2-pak 0.3 Mg/0.71ml Devi (Epinephrine) .... Use  as directed as needed 7)  Hydrochlorothiazide 25 Mg Tabs (Hydrochlorothiazide) .... Take 1/2 tab by mouth every morning as needed 8)  Diltiazem Hcl Cr 180 Mg Cp24 (Diltiazem hcl) .... Take 1  tablet by mouth two times a day 9)  Avapro 150 Mg Tabs (Irbesartan) .... Take 1 tablet by mouth two times a day 10)  Bystolic 20 Mg Tabs (Nebivolol hcl) .Marland Kitchen.. 1 by mouth daily 11)  Ativan 0.5 Mg Tabs (Lorazepam) .Marland Kitchen.. 1 q 6 hours as needed anxiety 12)  Aspirin 325 Mg Tabs (Aspirin) .... Take 1 tab by mouth every day  Patient Instructions: 1)  Please schedule a follow-up appointment in 1 month. 2)  cancel 7/12 appt

## 2010-03-03 NOTE — Assessment & Plan Note (Signed)
Summary: 1 month rov/njr/pt rsc/cjr/pt rescd per dr swords//ccm   Vital Signs:  Patient profile:   77 year old female Weight:      144.5 pounds Temp:     98.5 degrees F oral Pulse rate:   48 / minute Pulse rhythm:   irregular Resp:     12 per minute BP sitting:   150 / 64  (left arm) Cuff size:   regular  Vitals Entered By: Gladis Riffle, RN (August 13, 2009 11:35 AM) CC: 1 month rov, c/o no energy--BP 112/52-181/64 at home Is Patient Diabetic? No   Primary Care Provider:  Birdie Sons, M.D.  CC:  1 month rov and c/o no energy--BP 112/52-181/64 at home.  History of Present Illness: main complaint is no energy stroke---no recurrence HTN---home BPs 117-181/50-60s---average BP 140/50 bradycardia---note diltiazem, bystolic and clonidine  tolerating meds  Preventive Screening-Counseling & Management  Alcohol-Tobacco     Smoking Status: never     Year Started: 1950     Year Quit: 1954  Current Medications (verified): 1)  Tums 500 Mg Chew (Calcium Carbonate Antacid) .... One Once Daily 2)  Lomotil 2.5-0.025 Mg Tabs (Diphenoxylate-Atropine) .... Once Daily As Needed 3)  Centrum Silver   Tabs (Multiple Vitamins-Minerals) .... Once Daily 4)  Benadryl 25 Mg  Caps (Diphenhydramine Hcl) .... Take 1 Tablet By Mouth At Bedtime--If Off Xyzal Can Take Every 4-6 Hours As Needed 5)  Clonidine Hcl 0.2 Mg Tabs (Clonidine Hcl) .... 1/2 Three Times Daily. 6)  Epipen 2-Pak 0.3 Mg/0.47ml Devi (Epinephrine) .... Use As Directed As Needed 7)  Hydrochlorothiazide 25 Mg  Tabs (Hydrochlorothiazide) .... Take 1/2 Tab By Mouth Every Morning As Needed 8)  Diltiazem Hcl Cr 180 Mg Cp24 (Diltiazem Hcl) .... Take 1  Tablet By Mouth Two Times A Day 9)  Avapro 150 Mg Tabs (Irbesartan) .... Take 1 Tablet By Mouth Two Times A Day 10)  Bystolic 20 Mg Tabs (Nebivolol Hcl) .Marland Kitchen.. 1 By Mouth Daily 11)  Ativan 0.5 Mg Tabs (Lorazepam) .Marland Kitchen.. 1 Q 6 Hours As Needed Anxiety 12)  Aspirin 325 Mg Tabs (Aspirin) .... Take 1 Tab  By Mouth Every Day  Allergies: 1)  ! Ace Inhibitors 2)  ! Felodipine (Felodipine) 3)  ! Famotidine (Famotidine) 4)  ! Fexofenadine Hcl (Fexofenadine Hcl) 5)  ! Metoprolol Tartrate (Metoprolol Tartrate) 6)  Penicillin V Potassium (Penicillin V Potassium) 7)  Pneumovax 23 (Pneumococcal Vac Polyvalent) 8)  Prinivil (Lisinopril)  Physical Exam  General:  alert and well-developed.   Head:  normocephalic and atraumatic.   Eyes:  pupils equal and pupils round.   Neck:  No deformities, masses, or tenderness noted. Chest Wall:  Symmetrical;  no deformities or tenderness. Lungs:  normal respiratory effort and no intercostal retractions.   Heart:  normal rate and regular rhythm.   Abdomen:  soft and non-tender.   Msk:  No deformity or scoliosis noted of thoracic or lumbar spine.   Neurologic:  cranial nerves II-XII intact and gait normal.     Impression & Recommendations:  Problem # 1:  HYPERTENSION (ICD-401.9) reasonable control but she is quite bradycardic at home (pulse recorded at 39 one time) The following medications were removed from the medication list:    Clonidine Hcl 0.2 Mg Tabs (Clonidine hcl) .Marland Kitchen... 1/2 three times daily.---could be related to bradycardia in context of other meds Her updated medication list for this problem includes:    Hydrochlorothiazide 25 Mg Tabs (Hydrochlorothiazide) .Marland Kitchen... Take 1/2 tab by mouth every morning  as needed    Diltiazem Hcl Cr 180 Mg Cp24 (Diltiazem hcl) .Marland Kitchen... Take 1  tablet by mouth two times a day    Avapro 150 Mg Tabs (Irbesartan) .Marland Kitchen... Take 1 tablet by mouth two times a day    Bystolic 20 Mg Tabs (Nebivolol hcl) .Marland Kitchen... 1 by mouth daily    Hydralazine Hcl 25 Mg Tabs (Hydralazine hcl) .Marland Kitchen... 1 by mouth 3 times daily  Problem # 2:  STROKE (ICD-434.91) no recurrence Her updated medication list for this problem includes:    Aspirin 325 Mg Tabs (Aspirin) .Marland Kitchen... Take 1 tab by mouth every day  Complete Medication List: 1)  Tums 500 Mg Chew  (Calcium carbonate antacid) .... One once daily 2)  Lomotil 2.5-0.025 Mg Tabs (Diphenoxylate-atropine) .... Once daily as needed 3)  Centrum Silver Tabs (Multiple vitamins-minerals) .... Once daily 4)  Benadryl 25 Mg Caps (Diphenhydramine hcl) .... Take 1 tablet by mouth at bedtime--if off xyzal can take every 4-6 hours as needed 5)  Epipen 2-pak 0.3 Mg/0.60ml Devi (Epinephrine) .... Use as directed as needed 6)  Hydrochlorothiazide 25 Mg Tabs (Hydrochlorothiazide) .... Take 1/2 tab by mouth every morning as needed 7)  Diltiazem Hcl Cr 180 Mg Cp24 (Diltiazem hcl) .... Take 1  tablet by mouth two times a day 8)  Avapro 150 Mg Tabs (Irbesartan) .... Take 1 tablet by mouth two times a day 9)  Bystolic 20 Mg Tabs (Nebivolol hcl) .Marland Kitchen.. 1 by mouth daily 10)  Ativan 0.5 Mg Tabs (Lorazepam) .Marland Kitchen.. 1 q 6 hours as needed anxiety 11)  Aspirin 325 Mg Tabs (Aspirin) .... Take 1 tab by mouth every day 12)  Hydralazine Hcl 25 Mg Tabs (Hydralazine hcl) .Marland Kitchen.. 1 by mouth 3 times daily  Patient Instructions: 1)  see me 2-3 weeks Prescriptions: HYDRALAZINE HCL 25 MG  TABS (HYDRALAZINE HCL) 1 by mouth 3 times daily  #90 x 3   Entered and Authorized by:   Birdie Sons MD   Signed by:   Birdie Sons MD on 08/13/2009   Method used:   Electronically to        [facility]* (retail)       [address]       [place], [place]  [id number]       Ph: [phone]       Fax: [phone]   RxID:   [id number]

## 2010-03-03 NOTE — Assessment & Plan Note (Signed)
Summary: 1 wk rov/njr/pt rsc/cjr   Vital Signs:  Patient profile:   77 year old female Weight:      142 pounds Temp:     97.9 degrees F oral Pulse rate:   48 / minute Pulse rhythm:   regular Resp:     12 per minute BP sitting:   158 / 80  Vitals Entered By: Lynann Beaver CMA (November 19, 2009 10:36 AM) CC: rov Is Patient Diabetic? No Pain Assessment Patient in pain? no        Primary Care Provider:  Birdie Sons, M.D.  CC:  rov.  History of Present Illness:  Follow-Up Visit      This is an 77 year old woman who presents for Follow-up visit.  The patient denies chest pain and palpitations.  Since the last visit the patient notes no new problems or concerns.  The patient reports monitoring BP.  When questioned about possible medication side effects, the patient notes none.    All other systems reviewed and were negative   Current Problems (verified): 1)  Anxiety State, Unspecified  (ICD-300.00) 2)  Stroke  (ICD-434.91) 3)  Angioedema  (ICD-995.1) 4)  Ibs  (ICD-564.1) 5)  Occlusion, Carotid Artery w/o Infarction  (ICD-433.10) 6)  Osteopenia  (ICD-733.90) 7)  Hypertension  (ICD-401.9)  Current Medications (verified): 1)  Tums 500 Mg Chew (Calcium Carbonate Antacid) .... One Once Daily 2)  Lomotil 2.5-0.025 Mg Tabs (Diphenoxylate-Atropine) .... Once Daily As Needed 3)  Centrum Silver   Tabs (Multiple Vitamins-Minerals) .... Once Daily 4)  Benadryl 25 Mg  Caps (Diphenhydramine Hcl) .... Take 1 Tablet By Mouth At Bedtime--If Off Xyzal Can Take Every 4-6 Hours As Needed 5)  Epipen 2-Pak 0.3 Mg/0.48ml Devi (Epinephrine) .... Use As Directed As Needed 6)  Hydrochlorothiazide 25 Mg  Tabs (Hydrochlorothiazide) .... Take 1/2 Tab By Mouth Every Morning As Needed 7)  Diltiazem Hcl Cr 180 Mg Cp24 (Diltiazem Hcl) .... Take 1  Tablet By Mouth Once Daily 8)  Bystolic 20 Mg Tabs (Nebivolol Hcl) .Marland Kitchen.. 1 By Mouth Daily 9)  Ativan 0.5 Mg Tabs (Lorazepam) .Marland Kitchen.. 1 Q 6 Hours As Needed  Anxiety 10)  Aspirin 325 Mg Tabs (Aspirin) .... Take 1 Tab By Mouth Every Day 11)  Benicar 40 Mg  Tabs (Olmesartan Medoxomil) .Marland Kitchen.. 1 By Mouth Once Daily  Allergies (verified): 1)  ! Ace Inhibitors 2)  ! Felodipine (Felodipine) 3)  ! Famotidine (Famotidine) 4)  ! Fexofenadine Hcl (Fexofenadine Hcl) 5)  ! Metoprolol Tartrate (Metoprolol Tartrate) 6)  Penicillin V Potassium (Penicillin V Potassium) 7)  Pneumovax 23 (Pneumococcal Vac Polyvalent) 8)  Prinivil (Lisinopril)  Past History:  Past Medical History: Last updated: 04/18/2009 Hypertension Osteopenia Carotid vascular disease (stenosis) angioedema-ACE inhibitor allergic reaction to pneumovax 10/03 Obesity Arthritis  cerebrovascular disease with right brain stroke/left hemiparesis  Past Surgical History: Last updated: 10/20/2007 Hysterectomy (no ca) Oophorectomy-unilateral-1963 Bunionectomy-1986 D&C-1962 [id number]  Family History: Last updated: 10/24/2007 Family History of Heart Disease: Father, Mother No FH of Colon Cancer:  Social History: Last updated: 10/24/2007 Retired Married Former Smoker Alcohol Use - yes Daily Caffeine Use  Risk Factors: Smoking Status: never (10/01/2009)  Physical Exam  General:  well-developed well-nourished female in no acute distress. HEENT exam atraumatic, normocephalic. Chest clear to auscultation without increased work of breathing. Cardiac exam S1-S2 are regular. She does have an S4. Extremities there is no clubbing cyanosis or edema. Neurologic exam she is alert. Gait is normal. Motor and sensory functions appeared intact.  Impression & Recommendations:  Problem # 1:  HYPERTENSION (ICD-401.9) bp is improving repeat bp 130/70 encouraged her to be more active and exercise more  home bps range 130s-150s/70s Her updated medication list for this problem includes:    Hydrochlorothiazide 25 Mg Tabs (Hydrochlorothiazide) .Marland Kitchen... Take 1/2 tab by mouth every morning as needed     Diltiazem Hcl Cr 180 Mg Cp24 (Diltiazem hcl) .Marland Kitchen... Take 1  tablet by mouth once daily    Bystolic 20 Mg Tabs (Nebivolol hcl) .Marland Kitchen... 1 by mouth daily    Benicar 40 Mg Tabs (Olmesartan medoxomil) .Marland Kitchen... 1 by mouth once daily  BP today: 158/80 Prior BP: 180/88 (11/10/2009)  Labs Reviewed: K+: 4.2 (04/02/2009) Creat: : 1.0 (04/02/2009)   Chol: 163 (04/02/2009)   HDL: 57.90 (04/02/2009)   LDL: 85 (04/02/2009)   TG: 102.0 (04/02/2009)  Problem # 2:  STROKE (ICD-434.91) resolved Her updated medication list for this problem includes:    Aspirin 325 Mg Tabs (Aspirin) .Marland Kitchen... Take 1 tab by mouth every day  Complete Medication List: 1)  Tums 500 Mg Chew (Calcium carbonate antacid) .... One once daily 2)  Lomotil 2.5-0.025 Mg Tabs (Diphenoxylate-atropine) .... Once daily as needed 3)  Centrum Silver Tabs (Multiple vitamins-minerals) .... Once daily 4)  Benadryl 25 Mg Caps (Diphenhydramine hcl) .... Take 1 tablet by mouth at bedtime--if off xyzal can take every 4-6 hours as needed 5)  Epipen 2-pak 0.3 Mg/0.75ml Devi (Epinephrine) .... Use as directed as needed 6)  Hydrochlorothiazide 25 Mg Tabs (Hydrochlorothiazide) .... Take 1/2 tab by mouth every morning as needed 7)  Diltiazem Hcl Cr 180 Mg Cp24 (Diltiazem hcl) .... Take 1  tablet by mouth once daily 8)  Bystolic 20 Mg Tabs (Nebivolol hcl) .Marland Kitchen.. 1 by mouth daily 9)  Ativan 0.5 Mg Tabs (Lorazepam) .Marland Kitchen.. 1 q 6 hours as needed anxiety 10)  Aspirin 325 Mg Tabs (Aspirin) .... Take 1 tab by mouth every day 11)  Benicar 40 Mg Tabs (Olmesartan medoxomil) .Marland Kitchen.. 1 by mouth once daily  Patient Instructions: 1)  Please schedule a follow-up appointment in 1 month.   Orders Added: 1)  Est. Patient Level IV [04540]

## 2010-03-03 NOTE — Assessment & Plan Note (Signed)
Summary: 4 month rov/njr/PT RSC/CJR/PT RSC/CJR/pt rescd//ccm   Vital Signs:  Patient profile:   77 year old female Weight:      154 pounds Temp:     98.1 degrees F Pulse rate:   66 / minute Pulse rhythm:   irregular Resp:     12 per minute BP sitting:   200 / 68  (left arm)  Vitals Entered By: Gladis Riffle, RN (February 19, 2009 2:36 PM)  Serial Vital Signs/Assessments:  Time      Position  BP       Pulse  Resp  Temp     By                     180/78                         Birdie Sons MD    Primary Care Provider:  Birdie Sons, M.D.   History of Present Illness:  Follow-Up Visit      This is an 77 year old woman who presents for Follow-up visit.  The patient denies chest pain, palpitations, dizziness, syncope, edema, SOB, DOE, PND, and orthopnea.  Since the last visit the patient notes no new problems or concerns.  The patient reports taking meds as prescribed and monitoring BP.  When questioned about possible medication side effects, the patient notes none.  BP not controlled All other systems reviewed and were negative   Preventive Screening-Counseling & Management  Alcohol-Tobacco     Smoking Status: quit > 6 months     Year Started: 1950     Year Quit: 1954  Current Problems (verified): 1)  Special Screening For Malignant Neoplasms Colon  (ICD-V76.51) 2)  Angioedema  (ICD-995.1) 3)  Ibs  (ICD-564.1) 4)  Occlusion, Carotid Artery w/o Infarction  (ICD-433.10) 5)  Disorder, Shoulder Region Nec  (ICD-726.2) 6)  Heart Murmur, Systolic  (ICD-785.2) 7)  Osteopenia  (ICD-733.90) 8)  Hypertension  (ICD-401.9)  Current Medications (verified): 1)  Aspir-Low 81 Mg Tbec (Aspirin) .... One By Mouth Daily 2)  Tums 500 Mg Chew (Calcium Carbonate Antacid) .... One Once Daily 3)  Lomotil 2.5-0.025 Mg Tabs (Diphenoxylate-Atropine) .... Once Daily As Needed 4)  Centrum Silver   Tabs (Multiple Vitamins-Minerals) .... Once Daily 5)  Benadryl 25 Mg  Caps (Diphenhydramine Hcl) ....  Take 1 Tablet By Mouth At Bedtime--If Off Xyzal Can Take Every 4-6 Hours As Needed 6)  Clonidine Hcl 0.2 Mg Tabs (Clonidine Hcl) .... One By Mouth Three Times A Day 7)  Triamcinolone Acetonide 0.1 % Crea (Triamcinolone Acetonide) .... Use Twice Daily As Needed For Itching 8)  Xyzal 5 Mg Tabs (Levocetirizine Dihydrochloride) .... 1/2-1 Evry Hs As Needed 9)  Epipen 2-Pak 0.3 Mg/0.51ml Devi (Epinephrine) .... Use As Directed As Needed 10)  Hydrochlorothiazide 25 Mg  Tabs (Hydrochlorothiazide) .... Take 1/2 Tab By Mouth Every Morning As Needed 11)  Diltiazem Hcl Cr 180 Mg Cp24 (Diltiazem Hcl) .Marland Kitchen.. 1 By Mouth Daily 12)  Avapro 150 Mg Tabs (Irbesartan) .Marland Kitchen.. 1 By Mouth Daily  Allergies: 1)  ! Ace Inhibitors 2)  ! Felodipine (Felodipine) 3)  ! Famotidine (Famotidine) 4)  ! Fexofenadine Hcl (Fexofenadine Hcl) 5)  ! Metoprolol Tartrate (Metoprolol Tartrate) 6)  Penicillin V Potassium (Penicillin V Potassium) 7)  Pneumovax 23 (Pneumococcal Vac Polyvalent) 8)  Prinivil (Lisinopril)  Comments:  Nurse/Medical Assistant: 4 month rov--BP 143/63-187/76; c/o no energy since avapro and diltiazem started  The  patient's medications and allergies were reviewed with the patient and were updated in the Medication and Allergy Lists. Gladis Riffle, RN (February 19, 2009 2:41 PM)  Past History:  Past Medical History: Last updated: 10/24/2007 Hypertension Osteopenia Carotid vascular disease (stenosis) angioedema-ACE inhibitor allergic reaction to pneumovax 10/03 Obesity Arthritis  Past Surgical History: Last updated: 10/20/2007 Hysterectomy (no ca) Oophorectomy-unilateral-1963 Bunionectomy-1986 D&C-1962 [id number]  Family History: Last updated: 10/24/2007 Family History of Heart Disease: Father, Mother No FH of Colon Cancer:  Social History: Last updated: 10/24/2007 Retired Married Former Smoker Alcohol Use - yes Daily Caffeine Use  Risk Factors: Smoking Status: quit > 6 months  (02/19/2009)  Physical Exam  General:  Well-developed,well-nourished,in no acute distress; alert,appropriate and cooperative throughout examination Head:  normocephalic and atraumatic.   Eyes:  pupils equal and pupils round.   Ears:  R ear normal and L ear normal.   Neck:  No deformities, masses, or tenderness noted. Chest Wall:  Symmetrical;  no deformities or tenderness. Lungs:  Normal respiratory effort, chest expands symmetrically. Lungs are clear to auscultation, no crackles or wheezes. Heart:  normal rate and regular rhythm.  3/6 systolic murmur Abdomen:  Bowel sounds positive,abdomen soft and non-tender without masses, organomegaly or hernias noted. Msk:  No deformity or scoliosis noted of thoracic or lumbar spine.   Neurologic:  cranial nerves II-XII intact and gait normal.     Impression & Recommendations:  Problem # 1:  HYPERTENSION (ICD-401.9) Assessment Deteriorated see serial assessment form  BP not controlled---has sxs of fatigue, dry mouth, etc... I suspect related to clonidine she will continue to monitor BP will decrease clonidine,  Her updated medication list for this problem includes:    Clonidine Hcl 0.2 Mg Tabs (Clonidine hcl) .Marland Kitchen... 1/2 by mouth three times a day    Hydrochlorothiazide 25 Mg Tabs (Hydrochlorothiazide) .Marland Kitchen... Take 1/2 tab by mouth every morning as needed    Diltiazem Hcl Cr 180 Mg Cp24 (Diltiazem hcl) .Marland Kitchen... Take 1 tablet by mouth two times a day    Avapro 150 Mg Tabs (Irbesartan) .Marland Kitchen... Take 1 tablet by mouth two times a day  BP today: 200/68 Prior BP: 212/78 (01/08/2009)  Labs Reviewed: K+: 3.6 (01/03/2008) Creat: : 0.9 (01/03/2008)   Chol: 177 (05/25/2007)   HDL: 57.8 (05/25/2007)   LDL: 103 (05/25/2007)   TG: 83 (05/25/2007)  Complete Medication List: 1)  Aspir-low 81 Mg Tbec (Aspirin) .... One by mouth daily 2)  Tums 500 Mg Chew (Calcium carbonate antacid) .... One once daily 3)  Lomotil 2.5-0.025 Mg Tabs (Diphenoxylate-atropine) ....  Once daily as needed 4)  Centrum Silver Tabs (Multiple vitamins-minerals) .... Once daily 5)  Benadryl 25 Mg Caps (Diphenhydramine hcl) .... Take 1 tablet by mouth at bedtime--if off xyzal can take every 4-6 hours as needed 6)  Clonidine Hcl 0.2 Mg Tabs (Clonidine hcl) .... 1/2 by mouth three times a day 7)  Triamcinolone Acetonide 0.1 % Crea (Triamcinolone acetonide) .... Use twice daily as needed for itching 8)  Xyzal 5 Mg Tabs (Levocetirizine dihydrochloride) .... 1/2-1 evry hs as needed 9)  Epipen 2-pak 0.3 Mg/0.57ml Devi (Epinephrine) .... Use as directed as needed 10)  Hydrochlorothiazide 25 Mg Tabs (Hydrochlorothiazide) .... Take 1/2 tab by mouth every morning as needed 11)  Diltiazem Hcl Cr 180 Mg Cp24 (Diltiazem hcl) .... Take 1 tablet by mouth two times a day 12)  Avapro 150 Mg Tabs (Irbesartan) .... Take 1 tablet by mouth two times a day  Patient Instructions: 1)  1  week

## 2010-03-03 NOTE — Progress Notes (Signed)
Summary: FYI?  Phone Note Call from Patient   Caller: Patient Call For: Birdie Sons MD Summary of Call: Pt is calling to let Dr. Cato Mulligan know that she is having difficulty with her left leg......and, she cannot remember if she told him that she had been on ASA before recent CVA.  Is waiting to hear from PT.  Sounds mildly confused. [phone] Initial call taken by: Lynann Beaver CMA,  April 03, 2009 10:14 AM  Follow-up for Phone Call        she is to stop ASA and start aggrenox see recent med list Make sure referred to therapy---see orders Follow-up by: Birdie Sons MD,  April 03, 2009 3:45 PM  Additional Follow-up for Phone Call Additional follow up Details #1::        Pt did stop ASA and started Aggrenox, and PT has called pt.   Additional Follow-up by: Lynann Beaver CMA,  April 03, 2009 3:54 PM

## 2010-03-03 NOTE — Progress Notes (Signed)
Summary: FYI  Phone Note Call from Patient   Caller: son, bill Call For: Birdie Sons MD Summary of Call: mother showed some confusion this AM when she made reference of calling her mother (deceased) if she needed help.  He just wanted you aware. Initial call taken by: Gladis Riffle, RN,  April 08, 2009 9:55 AM

## 2010-03-03 NOTE — Assessment & Plan Note (Signed)
Summary: 1 month follow up/cjr/pt rsc/cjr   Vital Signs:  Patient profile:   77 year old female Weight:      143 pounds BMI:     27.12 Temp:     98.4 degrees F oral Pulse rate:   46 / minute Pulse rhythm:   irregular Resp:     12 per minute BP sitting:   120 / 58  (left arm) Cuff size:   regular  Vitals Entered By: Gladis Riffle, RN (October 01, 2009 9:40 AM) CC: 1 month rov Is Patient Diabetic? No   Primary Care Shenika Quint:  Birdie Sons, M.D.  CC:  1 month rov.  History of Present Illness:  Follow-Up Visit      This is an 77 year old woman who presents for Follow-up visit.  The patient denies chest pain and palpitations.  Since the last visit the patient notes no new problems or concerns.  The patient reports taking meds as prescribed.  When questioned about possible medication side effects, the patient notes none.    All other systems reviewed and were negative   Preventive Screening-Counseling & Management  Alcohol-Tobacco     Smoking Status: never     Year Started: 1950     Year Quit: 1954  Current Medications (verified): 1)  Tums 500 Mg Chew (Calcium Carbonate Antacid) .... One Once Daily 2)  Lomotil 2.5-0.025 Mg Tabs (Diphenoxylate-Atropine) .... Once Daily As Needed 3)  Centrum Silver   Tabs (Multiple Vitamins-Minerals) .... Once Daily 4)  Benadryl 25 Mg  Caps (Diphenhydramine Hcl) .... Take 1 Tablet By Mouth At Bedtime--If Off Xyzal Can Take Every 4-6 Hours As Needed 5)  Epipen 2-Pak 0.3 Mg/0.9ml Devi (Epinephrine) .... Use As Directed As Needed 6)  Hydrochlorothiazide 25 Mg  Tabs (Hydrochlorothiazide) .... Take 1/2 Tab By Mouth Every Morning As Needed 7)  Diltiazem Hcl Cr 180 Mg Cp24 (Diltiazem Hcl) .... Take 1  Tablet By Mouth Two Times A Day 8)  Avapro 150 Mg Tabs (Irbesartan) .... Take 1 Tablet By Mouth Two Times A Day 9)  Bystolic 20 Mg Tabs (Nebivolol Hcl) .Marland Kitchen.. 1 By Mouth Daily 10)  Ativan 0.5 Mg Tabs (Lorazepam) .Marland Kitchen.. 1 Q 6 Hours As Needed Anxiety 11)   Aspirin 325 Mg Tabs (Aspirin) .... Take 1 Tab By Mouth Every Day  Allergies: 1)  ! Ace Inhibitors 2)  ! Felodipine (Felodipine) 3)  ! Famotidine (Famotidine) 4)  ! Fexofenadine Hcl (Fexofenadine Hcl) 5)  ! Metoprolol Tartrate (Metoprolol Tartrate) 6)  Penicillin V Potassium (Penicillin V Potassium) 7)  Pneumovax 23 (Pneumococcal Vac Polyvalent) 8)  Prinivil (Lisinopril)  Past History:  Past Medical History: Last updated: 04/18/2009 Hypertension Osteopenia Carotid vascular disease (stenosis) angioedema-ACE inhibitor allergic reaction to pneumovax 10/03 Obesity Arthritis  cerebrovascular disease with right brain stroke/left hemiparesis  Past Surgical History: Last updated: 10/20/2007 Hysterectomy (no ca) Oophorectomy-unilateral-1963 Bunionectomy-1986 D&C-1962 [id number]  Family History: Last updated: 10/24/2007 Family History of Heart Disease: Father, Mother No FH of Colon Cancer:  Social History: Last updated: 10/24/2007 Retired Married Former Smoker Alcohol Use - yes Daily Caffeine Use  Risk Factors: Smoking Status: never (10/01/2009)  Physical Exam  General:  alert and well-developed.   Head:  normocephalic and atraumatic.   Eyes:  pupils equal and pupils round.   Ears:  R ear normal and L ear normal.   Neck:  No deformities, masses, or tenderness noted. Chest Wall:  Symmetrical;  no deformities or tenderness. Lungs:  normal respiratory effort and no intercostal  retractions.   Heart:  regular rhythm and bradycardia.   Abdomen:  soft and non-tender.   Msk:  No deformity or scoliosis noted of thoracic or lumbar spine.   Neurologic:  cranial nerves II-XII intact and gait normal.     Impression & Recommendations:  Problem # 1:  STROKE (ICD-434.91) no recurrent sxs Her updated medication list for this problem includes:    Aspirin 325 Mg Tabs (Aspirin) .Marland Kitchen... Take 1 tab by mouth every day  Problem # 2:  HYPERTENSION (ICD-401.9) bps here have been better  than prior to stroke home bps 150/70 i have decreased diltiazem because of bradycardia (my exam HR is regular at 40 bpm)---could be cause of fatigue Her updated medication list for this problem includes:    Hydrochlorothiazide 25 Mg Tabs (Hydrochlorothiazide) .Marland Kitchen... Take 1/2 tab by mouth every morning as needed    Diltiazem Hcl Cr 180 Mg Cp24 (Diltiazem hcl) .Marland Kitchen... Take 1  tablet by mouth once daily    Avapro 150 Mg Tabs (Irbesartan) .Marland Kitchen... Take 1 tablet by mouth two times a day    Bystolic 20 Mg Tabs (Nebivolol hcl) .Marland Kitchen... 1 by mouth daily  BP today: 120/58 Prior BP: 140/58 (08/27/2009)  Labs Reviewed: K+: 4.2 (04/02/2009) Creat: : 1.0 (04/02/2009)   Chol: 163 (04/02/2009)   HDL: 57.90 (04/02/2009)   LDL: 85 (04/02/2009)   TG: 102.0 (04/02/2009)  Problem # 3:  ANGIOEDEMA (ICD-995.1) no recurrence  Problem # 4:  HEART MURMUR, SYSTOLIC (ICD-785.2) has had previous eval  see echo report  Complete Medication List: 1)  Tums 500 Mg Chew (Calcium carbonate antacid) .... One once daily 2)  Lomotil 2.5-0.025 Mg Tabs (Diphenoxylate-atropine) .... Once daily as needed 3)  Centrum Silver Tabs (Multiple vitamins-minerals) .... Once daily 4)  Benadryl 25 Mg Caps (Diphenhydramine hcl) .... Take 1 tablet by mouth at bedtime--if off xyzal can take every 4-6 hours as needed 5)  Epipen 2-pak 0.3 Mg/0.86ml Devi (Epinephrine) .... Use as directed as needed 6)  Hydrochlorothiazide 25 Mg Tabs (Hydrochlorothiazide) .... Take 1/2 tab by mouth every morning as needed 7)  Diltiazem Hcl Cr 180 Mg Cp24 (Diltiazem hcl) .... Take 1  tablet by mouth once daily 8)  Avapro 150 Mg Tabs (Irbesartan) .... Take 1 tablet by mouth two times a day 9)  Bystolic 20 Mg Tabs (Nebivolol hcl) .Marland Kitchen.. 1 by mouth daily 10)  Ativan 0.5 Mg Tabs (Lorazepam) .Marland Kitchen.. 1 q 6 hours as needed anxiety 11)  Aspirin 325 Mg Tabs (Aspirin) .... Take 1 tab by mouth every day  Patient Instructions: 1)  3-4 weeks    Echocardiogram Report  Procedure  date:  11/24/2005  Findings:       SUMMARY   -  Overall left ventricular systolic function was normal. Left         ventricular ejection fraction was estimated to be 65 %. There         were no left ventricular regional wall motion abnormalities.         Left ventricular wall thickness was mildly increased.   -  Aortic valve thickness was mildly to moderately increased. There         was mildly reduced aortic valve leaflet excursion. Findings         were consistent with very mild aortic valve stenosis. There         was mild to moderate aortic valvular regurgitation. The mean         transaortic valve gradient was  12 mmHg. Estimated aortic         valve area (by VTI) was 1.6 cm^2. Estimated aortic valve area         (by Vmax) was 1.98 cm^2.   -  There was mild to moderate mitral annular calcification. There         was mild mitral valvular regurgitation. Mean transmitral         gradient was 3 mmHg. Mitral valve area by pressure half-time         was 2 cm^2.   -  The left atrium was moderately dilated.    ---------------------------------------------------------------   Prepared and Electronically Authenticated by   Willa Rough M.D.   Confirmed 24-Nov-2005 12:26:11   Appended Document: 1 month follow up/cjr/pt rsc/cjr Flu Vaccine Consent Questions     Do you have a history of severe allergic reactions to this vaccine? no    Any prior history of allergic reactions to egg and/or gelatin? no    Do you have a sensitivity to the preservative Thimersol? no    Do you have a past history of Guillan-Barre Syndrome? no    Do you currently have an acute febrile illness? no    Have you ever had a severe reaction to latex? no    Vaccine information given and explained to patient? yes    Are you currently pregnant? no    Lot Number:[id number]   Exp Date:08/01/2010   Site Given  Left Deltoid IM

## 2010-03-03 NOTE — Assessment & Plan Note (Signed)
Summary: 1 mo rov/mm---PT [facility] // RS   Vital Signs:  Patient profile:   77 year old female Weight:      145 pounds Temp:     98.2 degrees F oral Pulse rate:   60 / minute Pulse rhythm:   regular BP sitting:   154 / 74  (left arm) Cuff size:   regular  Vitals Entered By: Alfred Levins, CMA (December 24, 2009 10:49 AM) CC: f/u on bp   Primary Care Provider:  Birdie Sons, M.D.  CC:  f/u on bp.  History of Present Illness:  Follow-Up Visit      This is an 77 year old woman who presents for Follow-up visit.  The patient denies chest pain and palpitations.  Since the last visit the patient notes no new problems or concerns.  The patient reports taking meds as prescribed except has run out of benciar.  When questioned about possible medication side effects, the patient notes none.   she has some fatigue but is generally feeling well  All other systems reviewed and were negative except she does admit to ongoing fatigue. She also admits to interrupted sleep---could be related to nocturia but she doesn't think so.     Current Problems (verified): 1)  Anxiety State, Unspecified  (ICD-300.00) 2)  Stroke  (ICD-434.91) 3)  Angioedema  (ICD-995.1) 4)  Ibs  (ICD-564.1) 5)  Occlusion, Carotid Artery w/o Infarction  (ICD-433.10) 6)  Osteopenia  (ICD-733.90) 7)  Hypertension  (ICD-401.9)  Current Medications (verified): 1)  Tums 500 Mg Chew (Calcium Carbonate Antacid) .... One Once Daily 2)  Lomotil 2.5-0.025 Mg Tabs (Diphenoxylate-Atropine) .... Once Daily As Needed 3)  Centrum Silver   Tabs (Multiple Vitamins-Minerals) .... Once Daily 4)  Benadryl 25 Mg  Caps (Diphenhydramine Hcl) .... Take 1 Tablet By Mouth At Bedtime--If Off Xyzal Can Take Every 4-6 Hours As Needed 5)  Hydrochlorothiazide 25 Mg  Tabs (Hydrochlorothiazide) .... Take 1/2 Tab By Mouth Every Morning As Needed 6)  Diltiazem Hcl Cr 180 Mg Cp24 (Diltiazem Hcl) .... Take 1  Tablet By Mouth Once Daily 7)  Bystolic 20 Mg Tabs  (Nebivolol Hcl) .Marland Kitchen.. 1 By Mouth Daily 8)  Aspirin 325 Mg Tabs (Aspirin) .... Take 1 Tab By Mouth Every Day  Allergies (verified): 1)  ! Ace Inhibitors 2)  ! Felodipine (Felodipine) 3)  ! Famotidine (Famotidine) 4)  ! Fexofenadine Hcl (Fexofenadine Hcl) 5)  ! Metoprolol Tartrate (Metoprolol Tartrate) 6)  Penicillin V Potassium (Penicillin V Potassium) 7)  Pneumovax 23 (Pneumococcal Vac Polyvalent) 8)  Prinivil (Lisinopril)  Physical Exam  General:  elderly female in no acute distress. HEENT exam atraumatic, normocephalic eyes her muscles are intact. Neck is supple. Chest clear to auscultation. Cardiac exam S1-S2 are regular. She does have an S4. Extremities no clubbing cyanosis or edema.   Impression & Recommendations:  Problem # 1:  STROKE (ICD-434.91) no recurrence Her updated medication list for this problem includes:    Aspirin 325 Mg Tabs (Aspirin) .Marland Kitchen... Take 1 tab by mouth every day  Problem # 2:  ANGIOEDEMA (ICD-995.1) no recurrence  Problem # 3:  HYPERTENSION (ICD-401.9) Home BPS are similar to current BP she has had significant bradycardia with other beta blockers in the past We have tried numerous bp meds in the past all with some sort of side effect.  her current bp is much better than in the past but still not controlled she ran out of benicar---requests generic trial losartan  The following medications  were removed from the medication list:    Benicar 40 Mg Tabs (Olmesartan medoxomil) .Marland Kitchen... 1 by mouth once daily Her updated medication list for this problem includes:    Hydrochlorothiazide 25 Mg Tabs (Hydrochlorothiazide) .Marland Kitchen... Take 1/2 tab by mouth every morning as needed    Diltiazem Hcl Cr 180 Mg Cp24 (Diltiazem hcl) .Marland Kitchen... Take 1  tablet by mouth once daily    Bystolic 20 Mg Tabs (Nebivolol hcl) .Marland Kitchen... 1 by mouth daily    Losartan Potassium 100 Mg Tabs (Losartan potassium) .Marland Kitchen... Take 1 tablet by mouth once a day  BP today: 154/74 Prior BP: 158/80  (11/19/2009)  Labs Reviewed: K+: 4.2 (04/02/2009) Creat: : 1.0 (04/02/2009)   Chol: 163 (04/02/2009)   HDL: 57.90 (04/02/2009)   LDL: 85 (04/02/2009)   TG: 102.0 (04/02/2009)  Complete Medication List: 1)  Tums 500 Mg Chew (Calcium carbonate antacid) .... One once daily 2)  Lomotil 2.5-0.025 Mg Tabs (Diphenoxylate-atropine) .... Once daily as needed 3)  Centrum Silver Tabs (Multiple vitamins-minerals) .... Once daily 4)  Benadryl 25 Mg Caps (Diphenhydramine hcl) .... Take 1 tablet by mouth at bedtime--if off xyzal can take every 4-6 hours as needed 5)  Hydrochlorothiazide 25 Mg Tabs (Hydrochlorothiazide) .... Take 1/2 tab by mouth every morning as needed 6)  Diltiazem Hcl Cr 180 Mg Cp24 (Diltiazem hcl) .... Take 1  tablet by mouth once daily 7)  Bystolic 20 Mg Tabs (Nebivolol hcl) .Marland Kitchen.. 1 by mouth daily 8)  Aspirin 325 Mg Tabs (Aspirin) .... Take 1 tab by mouth every day 9)  Losartan Potassium 100 Mg Tabs (Losartan potassium) .... Take 1 tablet by mouth once a day  Patient Instructions: 1)  Please schedule a follow-up appointment in 1 month. Prescriptions: BYSTOLIC 20 MG TABS (NEBIVOLOL HCL) 1 by mouth daily  #30 x 11   Entered and Authorized by:   Birdie Sons MD   Signed by:   Birdie Sons MD on 12/24/2009   Method used:   Electronically to        [facility]* (retail)       [address]       [place], [place]  [id number]       Ph: [phone]       Fax: [phone]   RxID:   [id number] LOSARTAN POTASSIUM 100 MG TABS (LOSARTAN POTASSIUM) Take 1 tablet by mouth once a day  #30 x 11   Entered and Authorized by:   Birdie Sons MD   Signed by:   Birdie Sons MD on 12/24/2009   Method used:   Electronically to        [facility]* (retail)       [address]       [name], [place]  [id number]       Ph: [phone]       Fax: [phone]   RxID:   [id number]    Orders Added: 1)  Est. Patient Level IV [07371]

## 2010-03-03 NOTE — Assessment & Plan Note (Signed)
Summary: 2 WK ROV/NJR   Vital Signs:  Patient profile:   77 year old female Weight:      143 pounds Temp:     97.9 degrees F oral Pulse rate:   44 / minute Pulse rhythm:   regular BP sitting:   140 / 58  (left arm) Cuff size:   regular  Vitals Entered By: Kern Reap CMA Duncan Dull) (August 27, 2009 11:15 AM) CC: 2 week follow visit   Primary Care Provider:  Birdie Sons, M.D.  CC:  2 week follow visit.  History of Present Illness:  Follow-Up Visit      This is an 77 year old woman who presents for Follow-up visit.  The patient denies chest pain and palpitations.  Since the last visit the patient notes no new problems or concerns.  The patient reports not taking meds as prescribed.  When questioned about possible medication side effects, the patient notes cramping.  hydralazine caused diarrhea.  home bps 110s/70s All other systems reviewed and were negative   Current Medications (verified): 1)  Tums 500 Mg Chew (Calcium Carbonate Antacid) .... One Once Daily 2)  Lomotil 2.5-0.025 Mg Tabs (Diphenoxylate-Atropine) .... Once Daily As Needed 3)  Centrum Silver   Tabs (Multiple Vitamins-Minerals) .... Once Daily 4)  Benadryl 25 Mg  Caps (Diphenhydramine Hcl) .... Take 1 Tablet By Mouth At Bedtime--If Off Xyzal Can Take Every 4-6 Hours As Needed 5)  Epipen 2-Pak 0.3 Mg/0.60ml Devi (Epinephrine) .... Use As Directed As Needed 6)  Hydrochlorothiazide 25 Mg  Tabs (Hydrochlorothiazide) .... Take 1/2 Tab By Mouth Every Morning As Needed 7)  Diltiazem Hcl Cr 180 Mg Cp24 (Diltiazem Hcl) .... Take 1  Tablet By Mouth Two Times A Day 8)  Avapro 150 Mg Tabs (Irbesartan) .... Take 1 Tablet By Mouth Two Times A Day 9)  Bystolic 20 Mg Tabs (Nebivolol Hcl) .Marland Kitchen.. 1 By Mouth Daily 10)  Ativan 0.5 Mg Tabs (Lorazepam) .Marland Kitchen.. 1 Q 6 Hours As Needed Anxiety 11)  Aspirin 325 Mg Tabs (Aspirin) .... Take 1 Tab By Mouth Every Day  Allergies (verified): 1)  ! Ace Inhibitors 2)  ! Felodipine (Felodipine) 3)  !  Famotidine (Famotidine) 4)  ! Fexofenadine Hcl (Fexofenadine Hcl) 5)  ! Metoprolol Tartrate (Metoprolol Tartrate) 6)  Penicillin V Potassium (Penicillin V Potassium) 7)  Pneumovax 23 (Pneumococcal Vac Polyvalent) 8)  Prinivil (Lisinopril)  Past History:  Past Medical History: Last updated: 04/18/2009 Hypertension Osteopenia Carotid vascular disease (stenosis) angioedema-ACE inhibitor allergic reaction to pneumovax 10/03 Obesity Arthritis  cerebrovascular disease with right brain stroke/left hemiparesis  Past Surgical History: Last updated: 10/20/2007 Hysterectomy (no ca) Oophorectomy-unilateral-1963 Bunionectomy-1986 D&C-1962 [id number]  Family History: Last updated: 10/24/2007 Family History of Heart Disease: Father, Mother No FH of Colon Cancer:  Social History: Last updated: 10/24/2007 Retired Married Former Smoker Alcohol Use - yes Daily Caffeine Use  Risk Factors: Smoking Status: never (08/13/2009)  Physical Exam  General:  alert and well-developed.   Head:  normocephalic and atraumatic.   Neck:  No deformities, masses, or tenderness noted. Chest Wall:  Symmetrical;  no deformities or tenderness. Lungs:  normal respiratory effort and no intercostal retractions.   Heart:  bradycardia.  - rate 54 2/6 hsm Skin:  turgor normal and color normal.   Psych:  good eye contact and not anxious appearing.     Impression & Recommendations:  Problem # 1:  STROKE (ICD-434.91) no recurrence Her updated medication list for this problem includes:    Aspirin  325 Mg Tabs (Aspirin) .Marland Kitchen... Take 1 tab by mouth every day  Problem # 2:  HYPERTENSION (ICD-401.9) reasonable control here great control at home continue current medications  note relative bradycardia---asymptomatic -- likely exacerbated by bystolic and diltiazem--- I don't want to change meds as BP is reasonbly controlled The following medications were removed from the medication list:    Hydralazine Hcl 25  Mg Tabs (Hydralazine hcl) .Marland Kitchen... 1 by mouth 3 times daily Her updated medication list for this problem includes:    Hydrochlorothiazide 25 Mg Tabs (Hydrochlorothiazide) .Marland Kitchen... Take 1/2 tab by mouth every morning as needed    Diltiazem Hcl Cr 180 Mg Cp24 (Diltiazem hcl) .Marland Kitchen... Take 1  tablet by mouth two times a day    Avapro 150 Mg Tabs (Irbesartan) .Marland Kitchen... Take 1 tablet by mouth two times a day    Bystolic 20 Mg Tabs (Nebivolol hcl) .Marland Kitchen... 1 by mouth daily  BP today: 140/58 Prior BP: 150/64 (08/13/2009)  Labs Reviewed: K+: 4.2 (04/02/2009) Creat: : 1.0 (04/02/2009)   Chol: 163 (04/02/2009)   HDL: 57.90 (04/02/2009)   LDL: 85 (04/02/2009)   TG: 102.0 (04/02/2009)  Problem # 3:  ANGIOEDEMA (ICD-995.1) no recurrence  Complete Medication List: 1)  Tums 500 Mg Chew (Calcium carbonate antacid) .... One once daily 2)  Lomotil 2.5-0.025 Mg Tabs (Diphenoxylate-atropine) .... Once daily as needed 3)  Centrum Silver Tabs (Multiple vitamins-minerals) .... Once daily 4)  Benadryl 25 Mg Caps (Diphenhydramine hcl) .... Take 1 tablet by mouth at bedtime--if off xyzal can take every 4-6 hours as needed 5)  Epipen 2-pak 0.3 Mg/0.81ml Devi (Epinephrine) .... Use as directed as needed 6)  Hydrochlorothiazide 25 Mg Tabs (Hydrochlorothiazide) .... Take 1/2 tab by mouth every morning as needed 7)  Diltiazem Hcl Cr 180 Mg Cp24 (Diltiazem hcl) .... Take 1  tablet by mouth two times a day 8)  Avapro 150 Mg Tabs (Irbesartan) .... Take 1 tablet by mouth two times a day 9)  Bystolic 20 Mg Tabs (Nebivolol hcl) .Marland Kitchen.. 1 by mouth daily 10)  Ativan 0.5 Mg Tabs (Lorazepam) .Marland Kitchen.. 1 q 6 hours as needed anxiety 11)  Aspirin 325 Mg Tabs (Aspirin) .... Take 1 tab by mouth every day  Patient Instructions: 1)  Please schedule a follow-up appointment in 1 month.

## 2010-03-03 NOTE — Assessment & Plan Note (Signed)
Summary: 3 WK ROV/NJR   Vital Signs:  Patient profile:   77 year old female Weight:      148.5 pounds Temp:     98.5 degrees F oral Pulse rate:   78 / minute Pulse rhythm:   irregular Resp:     12 per minute BP sitting:   188 / 72  (left arm)  Vitals Entered By: Gladis Riffle, RN (April 29, 2009 10:05 AM) CC: 3 week rov, saw Dr Caryl Never last week and clonidine increased to one tid.  c/o diarrhea qod x 3-4 weeks Is Patient Diabetic? No Comments sonstaes she is on 1/2 tab clonidine three times a day at present   Primary Care Provider:  Birdie Sons, M.D.  CC:  3 week rov and saw Dr Caryl Never last week and clonidine increased to one tid.  c/o diarrhea qod x 3-4 weeks.  History of Present Illness: Stroke: physically she is doing well, neurologically doing much better.  HTN---has been difficult to control part of the difficulty has been related to non compliance and self medication. Home BPs 150-190/70s-80s  Physical Therapy: She has only been one time  Angioedema: no recurrence.  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 Take 1 Capsule By Mouth Twice A Day 2)  Tums 500 Mg Chew (Calcium Carbonate Antacid) .... One Once Daily 3)  Lomotil 2.5-0.025 Mg Tabs (Diphenoxylate-Atropine)  .... Once Daily As Needed 4)  Centrum Silver   Tabs (Multiple Vitamins-Minerals) .... Once Daily 5)  Benadryl 25 Mg  Caps (Diphenhydramine Hcl) .... Take 1 Tablet By Mouth At Bedtime--If Off Xyzal Can Take Every 4-6 Hours As Needed 6)  Clonidine Hcl 0.2 Mg Tabs (Clonidine Hcl) .... One  By Mouth Three Times A Day 7)  Epipen 2-Pak 0.3 Mg/0.22ml Devi (Epinephrine) .... Use As Directed As Needed 8)  Hydrochlorothiazide 25 Mg  Tabs (Hydrochlorothiazide) .... Take 1/2 Tab By Mouth Every Morning As Needed 9)  Diltiazem Hcl Cr 180 Mg Cp24 (Diltiazem Hcl) .... Take 1  Tablet By Mouth Two Times A Day 10)  Avapro 150 Mg Tabs (Irbesartan) .... Take 1 Tablet By Mouth Two Times A Day  Allergies: 1)  ! Ace Inhibitors 2)  ! Felodipine (Felodipine) 3)  ! Famotidine (Famotidine) 4)  ! Fexofenadine Hcl (Fexofenadine Hcl) 5)  ! Metoprolol Tartrate (Metoprolol Tartrate) 6)  Penicillin V Potassium (Penicillin V Potassium) 7)  Pneumovax 23 (Pneumococcal Vac Polyvalent) 8)  Prinivil (Lisinopril)  Past History:  Past Medical History: Last updated: 04/18/2009 Hypertension Osteopenia Carotid vascular disease (stenosis) angioedema-ACE inhibitor allergic reaction to pneumovax 10/03 Obesity Arthritis  cerebrovascular disease with right brain stroke/left hemiparesis  Past Surgical History: Last updated: 10/20/2007 Hysterectomy (no ca) Oophorectomy-unilateral-1963 Bunionectomy-1986 D&C-1962 [id number]  Family History: Last updated: 10/24/2007 Family History of  Heart Disease: Father, Mother No FH of Colon Cancer:  Social History: Last updated: 10/24/2007 Retired Married Former Smoker Alcohol Use - yes Daily Caffeine Use  Risk Factors: Smoking Status: never (04/29/2009)  Physical Exam  General:  Well-developed,well-nourished,in no acute distress; alert,appropriate and cooperative throughout examination Head:  normocephalic and atraumatic.   Eyes:  pupils equal and pupils round.   Ears:  R  ear normal and L ear normal.   Neck:  No deformities, masses, or tenderness noted. Chest Wall:  Symmetrical;  no deformities or tenderness. Lungs:  normal respiratory effort and no intercostal retractions.   Heart:  normal rate and regular rhythm.   Abdomen:  Bowel sounds positive,abdomen soft and non-tender without masses, organomegaly or hernias noted. Msk:  No deformity or scoliosis noted of thoracic or lumbar spine.   Neurologic:  cranial nerves II-XII intact and gait normal.   Skin:  turgor normal and color normal.   Psych:  normally interactive and good eye contact.     Impression & Recommendations:  Problem # 1:  STROKE (ICD-434.91) main issue is risk factor modification BP control is key she has not taken any meds today  Her updated medication list for this problem includes:    Aggrenox 25-200 Mg Cp12 (Aspirin-dipyridamole) .Marland Kitchen... Take 1 capsule by mouth twice a day  Problem # 2:  HYPERTENSION (ICD-401.9) se above Her updated medication list for this problem includes:    Clonidine Hcl 0.2 Mg Tabs (Clonidine hcl) ..... One  by mouth three times a day    Hydrochlorothiazide 25 Mg Tabs (Hydrochlorothiazide) .Marland Kitchen... Take 1/2 tab by mouth every morning as needed    Diltiazem Hcl Cr 180 Mg Cp24 (Diltiazem hcl) .Marland Kitchen... Take 1  tablet by mouth two times a day    Avapro 150 Mg Tabs (Irbesartan) .Marland Kitchen... Take 1 tablet by mouth two times a day    Bystolic 10 Mg Tabs (Nebivolol hcl) .Marland Kitchen... 1 by mouth daily  Problem # 3:  HEART MURMUR, SYSTOLIC (ICD-785.2) no further eval at this time  Problem # 4:  OCCLUSION, CAROTID ARTERY W/O INFARCTION (ICD-433.10) reviewed MRI/MRA  Her updated medication list for this problem includes:    Aggrenox 25-200 Mg Cp12 (Aspirin-dipyridamole) .Marland Kitchen... Take 1 capsule by mouth twice a day  Complete Medication List: 1)  Aggrenox 25-200 Mg Cp12 (Aspirin-dipyridamole) .... Take 1 capsule by mouth twice a day 2)  Tums 500 Mg Chew (Calcium carbonate antacid) .... One  once daily 3)  Lomotil 2.5-0.025 Mg Tabs (Diphenoxylate-atropine) .... Once daily as needed 4)  Centrum Silver Tabs (Multiple vitamins-minerals) .... Once daily 5)  Benadryl 25 Mg Caps (Diphenhydramine hcl) .... Take 1 tablet by mouth at bedtime--if off xyzal can take every 4-6 hours as needed 6)  Clonidine Hcl 0.2 Mg Tabs (Clonidine hcl) .... One  by mouth three times a day 7)  Epipen 2-pak 0.3 Mg/0.67ml Devi (Epinephrine) .... Use as directed as needed 8)  Hydrochlorothiazide 25 Mg Tabs (Hydrochlorothiazide) .... Take 1/2 tab by mouth every morning as needed 9)  Diltiazem Hcl Cr 180 Mg Cp24 (Diltiazem hcl) .... Take 1  tablet by mouth two times a day 10)  Avapro 150 Mg Tabs (Irbesartan) .... Take 1 tablet by mouth two times a day 11)  Bystolic 10 Mg Tabs (Nebivolol hcl) .Marland Kitchen.. 1 by mouth daily  Patient Instructions: 1)  Please schedule a follow-up appointment in 2 weeks. Prescriptions: BYSTOLIC 10 MG  TABS (NEBIVOLOL HCL) 1 by mouth daily  #90 x 3  Entered and Authorized by:   Birdie Sons MD   Signed by:   Birdie Sons MD on 04/29/2009   Method used:   Samples Given   RxID:   [id number]

## 2010-03-03 NOTE — Miscellaneous (Signed)
Summary: Initial Summary for PT Services/[place]  Initial Summary for PT Services/[place]   Imported By: Maryln Gottron 04/29/2009 13:20:26  _____________________________________________________________________  External Attachment:    Type:   Image     Comment:   External Document

## 2010-03-03 NOTE — Assessment & Plan Note (Signed)
Summary: 1 WK ROV // RS   Vital Signs:  Patient profile:   77 year old female Weight:      153 pounds Temp:     98 degrees F Pulse rate:   79 / minute Pulse rhythm:   irregular Resp:     12 per minute BP sitting:   168 / 76  (left arm)  Vitals Entered By: Gladis Riffle, RN (February 27, 2009 10:01 AM)  Serial Vital Signs/Assessments:  Time      Position  BP       Pulse  Resp  Temp     By                     150/70                         Birdie Sons MD    Primary Care Provider:  Birdie Sons, M.D.   History of Present Illness: htn---much better--in general home bps 150/70. but has been as high as 184/76.  tolerating medications but doesn't like taking the niumber of meds she is taking. she denies any headache or neurologic deficit,no CP, SOB, PND  All other systems reviewed and were negative   Preventive Screening-Counseling & Management  Alcohol-Tobacco     Smoking Status: quit > 6 months     Year Started: 1952     Year Quit: 1954  Current Problems (verified): 1)  Angioedema  (ICD-995.1) 2)  Ibs  (ICD-564.1) 3)  Occlusion, Carotid Artery w/o Infarction  (ICD-433.10) 4)  Disorder, Shoulder Region Nec  (ICD-726.2) 5)  Heart Murmur, Systolic  (ICD-785.2) 6)  Osteopenia  (ICD-733.90) 7)  Hypertension  (ICD-401.9)  Current Medications (verified): 1)  Aspir-Low 81 Mg Tbec (Aspirin) .... One By Mouth Daily 2)  Tums 500 Mg Chew (Calcium Carbonate Antacid) .... One Once Daily 3)  Lomotil 2.5-0.025 Mg Tabs (Diphenoxylate-Atropine) .... Once Daily As Needed 4)  Centrum Silver   Tabs (Multiple Vitamins-Minerals) .... Once Daily 5)  Benadryl 25 Mg  Caps (Diphenhydramine Hcl) .... Take 1 Tablet By Mouth At Bedtime--If Off Xyzal Can Take Every 4-6 Hours As Needed 6)  Clonidine Hcl 0.2 Mg Tabs (Clonidine Hcl) .... 1/2 By Mouth Three Times A Day 7)  Triamcinolone Acetonide 0.1 % Crea (Triamcinolone Acetonide) .... Use Twice Daily As Needed For Itching 8)  Xyzal 5 Mg Tabs  (Levocetirizine Dihydrochloride) .... 1/2-1 Evry Hs As Needed 9)  Epipen 2-Pak 0.3 Mg/0.69ml Devi (Epinephrine) .... Use As Directed As Needed 10)  Hydrochlorothiazide 25 Mg  Tabs (Hydrochlorothiazide) .... Take 1/2 Tab By Mouth Every Morning As Needed 11)  Diltiazem Hcl Cr 180 Mg Cp24 (Diltiazem Hcl) .... Take 1 Tablet By Mouth Two Times A Day 12)  Avapro 150 Mg Tabs (Irbesartan) .... Take 1 Tablet By Mouth Two Times A Day  Allergies: 1)  ! Ace Inhibitors 2)  ! Felodipine (Felodipine) 3)  ! Famotidine (Famotidine) 4)  ! Fexofenadine Hcl (Fexofenadine Hcl) 5)  ! Metoprolol Tartrate (Metoprolol Tartrate) 6)  Penicillin V Potassium (Penicillin V Potassium) 7)  Pneumovax 23 (Pneumococcal Vac Polyvalent) 8)  Prinivil (Lisinopril)  Comments:  Nurse/Medical Assistant: one week rov--BP  The patient's medications and allergies were reviewed with the patient and were updated in the Medication and Allergy Lists. Gladis Riffle, RN (February 27, 2009 10:05 AM)  Past History:  Past Medical History: Last updated: 10/24/2007 Hypertension Osteopenia Carotid vascular disease (stenosis) angioedema-ACE inhibitor allergic reaction to  pneumovax 10/03 Obesity Arthritis  Past Surgical History: Last updated: 10/20/2007 Hysterectomy (no ca) Oophorectomy-unilateral-1963 Bunionectomy-1986 D&C-1962 [id number]  Family History: Last updated: 10/24/2007 Family History of Heart Disease: Father, Mother No FH of Colon Cancer:  Social History: Last updated: 10/24/2007 Retired Married Former Smoker Alcohol Use - yes Daily Caffeine Use  Risk Factors: Smoking Status: quit > 6 months (02/27/2009)  Review of Systems       All other systems reviewed and were negative   Physical Exam  General:  Well-developed,well-nourished,in no acute distress; alert,appropriate and cooperative throughout examination Head:  normocephalic and atraumatic.   Eyes:  pupils equal and pupils round.   Ears:  R ear  normal and L ear normal.   Neck:  No deformities, masses, or tenderness noted. Chest Wall:  Symmetrical;  no deformities or tenderness. Lungs:  Normal respiratory effort, chest expands symmetrically. Lungs are clear to auscultation, no crackles or wheezes. Heart:  normal rate and regular rhythm.  3/6 systolic murmur Abdomen:  Bowel sounds positive,abdomen soft and non-tender without masses, organomegaly or hernias noted. Msk:  No deformity or scoliosis noted of thoracic or lumbar spine.   Pulses:  R radial normal and L radial normal.   Neurologic:  cranial nerves II-XII intact and gait normal.     Impression & Recommendations:  Problem # 1:  HYPERTENSION (ICD-401.9)  has been difficult to treat BUT BP better now than it has been.  dry mouth better on less clonidine for now , will continue meds and I'll see in 2-3 weeks  Her updated medication list for this problem includes:    Clonidine Hcl 0.2 Mg Tabs (Clonidine hcl) .Marland Kitchen... 1/2 by mouth three times a day    Hydrochlorothiazide 25 Mg Tabs (Hydrochlorothiazide) .Marland Kitchen... Take 1/2 tab by mouth every morning as needed    Diltiazem Hcl Cr 180 Mg Cp24 (Diltiazem hcl) .Marland Kitchen... Take 1  tablet by mouth two times a day    Avapro 150 Mg Tabs (Irbesartan) .Marland Kitchen... Take 1 tablet by mouth two times a day  Orders: Prescription Created Electronically [phone])  BP today: 168/76 Prior BP: 200/68 (02/19/2009)  Labs Reviewed: K+: 3.6 (01/03/2008) Creat: : 0.9 (01/03/2008)   Chol: 177 (05/25/2007)   HDL: 57.8 (05/25/2007)   LDL: 103 (05/25/2007)   TG: 83 (05/25/2007)  Complete Medication List: 1)  Aspir-low 81 Mg Tbec (Aspirin) .... One by mouth daily 2)  Tums 500 Mg Chew (Calcium carbonate antacid) .... One once daily 3)  Lomotil 2.5-0.025 Mg Tabs (Diphenoxylate-atropine) .... Once daily as needed 4)  Centrum Silver Tabs (Multiple vitamins-minerals) .... Once daily 5)  Benadryl 25 Mg Caps (Diphenhydramine hcl) .... Take 1 tablet by mouth at bedtime--if off  xyzal can take every 4-6 hours as needed 6)  Clonidine Hcl 0.2 Mg Tabs (Clonidine hcl) .... 1/2 by mouth three times a day 7)  Triamcinolone Acetonide 0.1 % Crea (Triamcinolone acetonide) .... Use twice daily as needed for itching 8)  Xyzal 5 Mg Tabs (Levocetirizine dihydrochloride) .... 1/2-1 evry hs as needed 9)  Epipen 2-pak 0.3 Mg/0.48ml Devi (Epinephrine) .... Use as directed as needed 10)  Hydrochlorothiazide 25 Mg Tabs (Hydrochlorothiazide) .... Take 1/2 tab by mouth every morning as needed 11)  Diltiazem Hcl Cr 180 Mg Cp24 (Diltiazem hcl) .... Take 1  tablet by mouth two times a day 12)  Avapro 150 Mg Tabs (Irbesartan) .... Take 1 tablet by mouth two times a day  Patient Instructions: 1)  Please schedule a follow-up appointment in 2-3 weeks.  Prescriptions: DILTIAZEM HCL CR 180 MG CP24 (DILTIAZEM HCL) Take 2  tablet by mouth two times a day  #360 x 3   Entered and Authorized by:   Birdie Sons MD   Signed by:   Birdie Sons MD on 02/27/2009   Method used:   Electronically to        [facility]* (retail)       [address]       [place], [place]  [id number]       Ph: [phone]       Fax: [phone]   RxID:   [id number]  corrected with [place] to 180mg  Take 1 tablet by mouth two times a day

## 2010-03-03 NOTE — Assessment & Plan Note (Signed)
Summary: BP ELEVATED 172/49 .[name] KitchenSLM   Vital Signs:  Patient profile:   77 year old female Temp:     98.8 degrees F oral BP sitting:   188 / 54  (left arm) Cuff size:   regular  Vitals Entered By: Sid Falcon LPN (April 23, 2009 2:30 PM) CC: BP elevated, low HR, feels jittery and shaking   History of Present Illness: Acute visit. Recent right basal ganglia stroke. Presents now for elevated blood pressure and feels somewhat nervous and jittery inside. Overall feels more fatigued and anxious since increasing clonidine from one half to one tablet 3 times daily.  History of treatment with multiple blood pressure medications. Currently on Avapro 150 mg b.i.d., diltiazem 180 mg b.i.d., clonidine 0.2 mg t.i.d. She is on HCTZ but rarely taking. She has urinary frequency with that medication. Reported intolerance to ACE inhibitors and metoprolol.  Had some recent left sided weakness mostly lower extremity. Completed a couple weeks of physical therapy. Ambulating without assistance. No speech changes or swallowing difficulties. Nonsmoker. Results of recent MRI and MR angiogram reviewed.  Allergies: 1)  ! Ace Inhibitors 2)  ! Felodipine (Felodipine) 3)  ! Famotidine (Famotidine) 4)  ! Fexofenadine Hcl (Fexofenadine Hcl) 5)  ! Metoprolol Tartrate (Metoprolol Tartrate) 6)  Penicillin V Potassium (Penicillin V Potassium) 7)  Pneumovax 23 (Pneumococcal Vac Polyvalent) 8)  Prinivil (Lisinopril)  Past History:  Past Medical History: Last updated: 04/18/2009 Hypertension Osteopenia Carotid vascular disease (stenosis) angioedema-ACE inhibitor allergic reaction to pneumovax 10/03 Obesity Arthritis  cerebrovascular disease with right brain stroke/left hemiparesis  Past Surgical History: Last updated: 10/20/2007 Hysterectomy (no ca) Oophorectomy-unilateral-1963 Bunionectomy-1986 D&C-1962 [id number]  Family History: Last updated: 10/24/2007 Family History of Heart Disease: Father,  Mother No FH of Colon Cancer:  Social History: Last updated: 10/24/2007 Retired Married Former Smoker Alcohol Use - yes Daily Caffeine Use  Risk Factors: Smoking Status: never (04/18/2009) PMH-FH-SH reviewed for relevance  Review of Systems  The patient denies anorexia, fever, weight loss, chest pain, syncope, dyspnea on exertion, peripheral edema, prolonged cough, headaches, hemoptysis, abdominal pain, melena, hematochezia, and severe indigestion/heartburn.    Physical Exam  General:  Well-developed,well-nourished,in no acute distress; alert,appropriate and cooperative throughout examination Head:  Normocephalic and atraumatic without obvious abnormalities. No apparent alopecia or balding. Eyes:  pupils equal, pupils round, and pupils reactive to light.   Mouth:  Oral mucosa and oropharynx without lesions or exudates.  Teeth in good repair. Neck:  No deformities, masses, or tenderness noted. Lungs:  Normal respiratory effort, chest expands symmetrically. Lungs are clear to auscultation, no crackles or wheezes. Heart:  normal rate and regular rhythm.  3/6 systolic murmur heard best right upper sternal border Extremities:  no edema Neurologic:  alert & oriented X3 and cranial nerves II-XII intact.  she has very minimal change with decreased strength left lower extremity compared to right. Symmetric reflexes. Psych:  good eye contact, not anxious appearing, and not depressed appearing.     Impression & Recommendations:  Problem # 1:  STROKE (ICD-434.91) Assessment Unchanged pt compliant with meds Her updated medication list for this problem includes:    Aggrenox 25-200 Mg Cp12 (Aspirin-dipyridamole) .[name] [name]... Take 1 capsule by mouth twice a day  Problem # 2:  HYPERTENSION (ICD-401.9) Assessment: Deteriorated get back on regular use of hydrochlorothiazide one half tablet daily and followup with primary physician next week Her updated medication list for this problem includes:     Clonidine Hcl 0.2 Mg Tabs (Clonidine hcl) ..... One  by  mouth three times a day    Hydrochlorothiazide 25 Mg Tabs (Hydrochlorothiazide) .[name] [name]... Take 1/2 tab by mouth every morning as needed    Diltiazem Hcl Cr 180 Mg Cp24 (Diltiazem hcl) .[name] [name]... Take 1  tablet by mouth two times a day    Avapro 150 Mg Tabs (Irbesartan) .[name] [name]... Take 1 tablet by mouth two times a day  Complete Medication List: 1)  Aggrenox 25-200 Mg Cp12 (Aspirin-dipyridamole) .... Take 1 capsule by mouth twice a day 2)  Tums 500 Mg Chew (Calcium carbonate antacid) .... One once daily 3)  Lomotil 2.5-0.025 Mg Tabs (Diphenoxylate-atropine) .... Once daily as needed 4)  Centrum Silver Tabs (Multiple vitamins-minerals) .... Once daily 5)  Benadryl 25 Mg Caps (Diphenhydramine hcl) .... Take 1 tablet by mouth at bedtime--if off xyzal can take every 4-6 hours as needed 6)  Clonidine Hcl 0.2 Mg Tabs (Clonidine hcl) .... One  by mouth three times a day 7)  Epipen 2-pak 0.3 Mg/0.39ml Devi (Epinephrine) .... Use as directed as needed 8)  Hydrochlorothiazide 25 Mg Tabs (Hydrochlorothiazide) .... Take 1/2 tab by mouth every morning as needed 9)  Diltiazem Hcl Cr 180 Mg Cp24 (Diltiazem hcl) .... Take 1  tablet by mouth two times a day 10)  Avapro 150 Mg Tabs (Irbesartan) .... Take 1 tablet by mouth two times a day  Patient Instructions: 1)  Continue all usual medications with the following modifications: 2)  Take hydrochlorothiazide 25 mg one half tablet every day 3)  Reduce clonidine back to one half tablet 3 times daily 4)  Keep followup with your primary physician next week 5)  Follow up immediately if you have any progressive weakness or new symptoms

## 2010-03-03 NOTE — Assessment & Plan Note (Signed)
Summary: 4 week fup//ccm/pt rescd to this day//ccm   Vital Signs:  Patient profile:   77 year old female Weight:      142 pounds Temp:     98.8 degrees F oral BP sitting:   180 / 88  (left arm) Cuff size:   regular  Vitals Entered By: Sid Falcon LPN (November 10, 2009 11:55 AM)  Primary Care Jie Stickels:  Birdie Sons, M.D.   History of Present Illness:  Follow-Up Visit      This is an 77 year old woman who presents for Follow-up visit.  The patient denies chest pain and palpitations.  Since the last visit the patient notes no new problems or concerns.  The patient reports taking meds as prescribed and monitoring BP (she states that home blood pressures are in the 140/80 range on average.).Marland Kitchen  When questioned about possible medication side effects, the patient notes none.    review of systems: She admits to general fatigue but no specific complaints such as chest pain, shortness of breath, PND, orthopnea. She denies any neurologic deficits. She denies any rashes, dull pain, change in appetite.  Current Problems (verified): 1)  Anxiety State, Unspecified  (ICD-300.00) 2)  Stroke  (ICD-434.91) 3)  Angioedema  (ICD-995.1) 4)  Ibs  (ICD-564.1) 5)  Occlusion, Carotid Artery w/o Infarction  (ICD-433.10) 6)  Disorder, Shoulder Region Nec  (ICD-726.2) 7)  Heart Murmur, Systolic  (ICD-785.2) 8)  Osteopenia  (ICD-733.90) 9)  Hypertension  (ICD-401.9)  Current Medications (verified): 1)  Tums 500 Mg Chew (Calcium Carbonate Antacid) .... One Once Daily 2)  Lomotil 2.5-0.025 Mg Tabs (Diphenoxylate-Atropine) .... Once Daily As Needed 3)  Centrum Silver   Tabs (Multiple Vitamins-Minerals) .... Once Daily 4)  Benadryl 25 Mg  Caps (Diphenhydramine Hcl) .... Take 1 Tablet By Mouth At Bedtime--If Off Xyzal Can Take Every 4-6 Hours As Needed 5)  Epipen 2-Pak 0.3 Mg/0.46ml Devi (Epinephrine) .... Use As Directed As Needed 6)  Hydrochlorothiazide 25 Mg  Tabs (Hydrochlorothiazide) .... Take 1/2 Tab By  Mouth Every Morning As Needed 7)  Diltiazem Hcl Cr 180 Mg Cp24 (Diltiazem Hcl) .... Take 1  Tablet By Mouth Once Daily 8)  Bystolic 20 Mg Tabs (Nebivolol Hcl) .Marland Kitchen.. 1 By Mouth Daily 9)  Ativan 0.5 Mg Tabs (Lorazepam) .Marland Kitchen.. 1 Q 6 Hours As Needed Anxiety 10)  Aspirin 325 Mg Tabs (Aspirin) .... Take 1 Tab By Mouth Every Day  Allergies: 1)  ! Ace Inhibitors 2)  ! Felodipine (Felodipine) 3)  ! Famotidine (Famotidine) 4)  ! Fexofenadine Hcl (Fexofenadine Hcl) 5)  ! Metoprolol Tartrate (Metoprolol Tartrate) 6)  Penicillin V Potassium (Penicillin V Potassium) 7)  Pneumovax 23 (Pneumococcal Vac Polyvalent) 8)  Prinivil (Lisinopril)  Physical Exam  General:  well-developed well-nourished female in no acute distress. HEENT exam atraumatic, normocephalic. Chest clear to auscultation without increased work of breathing. Cardiac exam S1-S2 are regular. She does have an S4. Extremities there is no clubbing cyanosis or edema. Neurologic exam she is alert. Gait is normal. Motor and sensory functions appeared intact.   Impression & Recommendations:  Problem # 1:  HYPERTENSION (ICD-401.9) she self dcd avapro---unclear why she needs more aggressive care The following medications were removed from the medication list:    Avapro 150 Mg Tabs (Irbesartan) .Marland Kitchen... Take 1 tablet by mouth two times a day Her updated medication list for this problem includes:    Hydrochlorothiazide 25 Mg Tabs (Hydrochlorothiazide) .Marland Kitchen... Take 1/2 tab by mouth every morning as needed  Diltiazem Hcl Cr 180 Mg Cp24 (Diltiazem hcl) .Marland Kitchen... Take 1  tablet by mouth once daily    Bystolic 20 Mg Tabs (Nebivolol hcl) .Marland Kitchen... 1 by mouth daily    Benicar 40 Mg Tabs (Olmesartan medoxomil) .Marland Kitchen... 1 by mouth once daily  Complete Medication List: 1)  Tums 500 Mg Chew (Calcium carbonate antacid) .... One once daily 2)  Lomotil 2.5-0.025 Mg Tabs (Diphenoxylate-atropine) .... Once daily as needed 3)  Centrum Silver Tabs (Multiple vitamins-minerals)  .... Once daily 4)  Benadryl 25 Mg Caps (Diphenhydramine hcl) .... Take 1 tablet by mouth at bedtime--if off xyzal can take every 4-6 hours as needed 5)  Epipen 2-pak 0.3 Mg/0.31ml Devi (Epinephrine) .... Use as directed as needed 6)  Hydrochlorothiazide 25 Mg Tabs (Hydrochlorothiazide) .... Take 1/2 tab by mouth every morning as needed 7)  Diltiazem Hcl Cr 180 Mg Cp24 (Diltiazem hcl) .... Take 1  tablet by mouth once daily 8)  Bystolic 20 Mg Tabs (Nebivolol hcl) .Marland Kitchen.. 1 by mouth daily 9)  Ativan 0.5 Mg Tabs (Lorazepam) .Marland Kitchen.. 1 q 6 hours as needed anxiety 10)  Aspirin 325 Mg Tabs (Aspirin) .... Take 1 tab by mouth every day 11)  Benicar 40 Mg Tabs (Olmesartan medoxomil) .Marland Kitchen.. 1 by mouth once daily  Patient Instructions: 1)  see me 1-2 weeks Prescriptions: BENICAR 40 MG  TABS (OLMESARTAN MEDOXOMIL) 1 by mouth once daily  #90 x 3   Entered and Authorized by:   Birdie Sons MD   Signed by:   Birdie Sons MD on 11/10/2009   Method used:   Samples Given   RxID:   [id number]

## 2010-03-03 NOTE — Assessment & Plan Note (Signed)
Summary: FOLLOW UP/CJR   Vital Signs:  Patient profile:   77 year old female Weight:      146 pounds BMI:     27.69 Temp:     98.3 degrees F oral Pulse rate:   48 / minute Pulse rhythm:   irregular Resp:     12 per minute BP sitting:   112 / 48  (left arm) Cuff size:   regular  Vitals Entered By: Gladis Riffle, RN (Jun 10, 2009 11:20 AM) CC: 1 month rov--discuss medication as would like to be off some Is Patient Diabetic? No   Primary Care Provider:  Birdie Sons, M.D.  CC:  1 month rov--discuss medication as would like to be off some.  History of Present Illness:  Follow-Up Visit      This is an 77 year old woman who presents for Follow-up visit.  The patient denies chest pain and palpitations.  Since the last visit the patient notes no new problems or concerns.  The patient reports taking meds as prescribed.  When questioned about possible medication side effects, the patient notes none.  recent stroke. She is recovering well. 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 Take 1 Capsule By Mouth Twice A Day 2)  Tums 500 Mg Chew (Calcium Carbonate Antacid) .... One Once Daily 3)  Lomotil 2.5-0.025 Mg Tabs (Diphenoxylate-Atropine) .... Once Daily As Needed 4)  Centrum Silver   Tabs (Multiple Vitamins-Minerals) .... Once Daily 5)  Benadryl 25 Mg   Caps (Diphenhydramine Hcl) .... Take 1 Tablet By Mouth At Bedtime--If Off Xyzal Can Take Every 4-6 Hours As Needed 6)  Clonidine Hcl 0.2 Mg Tabs (Clonidine Hcl) .... 1/2 Three Times Daily. 7)  Epipen 2-Pak 0.3 Mg/0.64ml Devi (Epinephrine) .... Use As Directed As Needed 8)  Hydrochlorothiazide 25 Mg  Tabs (Hydrochlorothiazide) .... Take 1/2 Tab By Mouth Every Morning As Needed 9)  Diltiazem Hcl Cr 180 Mg Cp24 (Diltiazem Hcl) .... Take 1  Tablet By Mouth Two Times A Day 10)  Avapro 150 Mg Tabs (Irbesartan) .... Take 1 Tablet By Mouth Two Times A Day 11)  Bystolic 20 Mg Tabs (Nebivolol Hcl) .Marland Kitchen.. 1 By Mouth Daily 12)  Ativan 0.5 Mg Tabs (Lorazepam) .Marland Kitchen.. 1 Q 6 Hours As Needed Anxiety  Current Medications (verified): 1)  Aggrenox 25-200 Mg  Cp12 (Aspirin-Dipyridamole) .... Take 1 Capsule By Mouth Twice A Day 2)  Tums 500 Mg Chew (Calcium Carbonate Antacid) .... One Once Daily 3)  Lomotil 2.5-0.025 Mg Tabs (Diphenoxylate-Atropine) .... Once Daily As Needed 4)  Centrum Silver   Tabs (Multiple Vitamins-Minerals) .... Once Daily 5)  Benadryl 25 Mg  Caps (Diphenhydramine Hcl) .... Take 1 Tablet By Mouth At Bedtime--If Off Xyzal Can Take Every 4-6 Hours As Needed 6)  Clonidine Hcl 0.2 Mg Tabs (Clonidine Hcl) .... 1/2  Three Times Daily. 7)  Epipen 2-Pak 0.3 Mg/0.4ml Devi (Epinephrine) .... Use As Directed As Needed 8)  Hydrochlorothiazide 25 Mg  Tabs (Hydrochlorothiazide) .... Take 1/2 Tab By Mouth Every Morning As Needed 9)  Diltiazem Hcl Cr 180 Mg Cp24 (Diltiazem Hcl) .... Take 1  Tablet By Mouth Two Times A Day 10)  Avapro 150 Mg Tabs (Irbesartan) .... Take 1 Tablet By Mouth Two Times A Day 11)  Bystolic 20 Mg Tabs (Nebivolol Hcl) .Marland Kitchen.. 1 By Mouth Daily 12)  Ativan 0.5 Mg Tabs (Lorazepam) .Marland Kitchen.. 1 Q 6 Hours As Needed Anxiety  Allergies: 1)  ! Ace Inhibitors 2)  ! Felodipine (Felodipine) 3)  ! Famotidine (Famotidine) 4)  ! Fexofenadine Hcl (Fexofenadine Hcl) 5)  ! Metoprolol Tartrate (Metoprolol  Tartrate) 6)  Penicillin V Potassium (Penicillin V Potassium) 7)  Pneumovax 23 (Pneumococcal Vac Polyvalent) 8)  Prinivil (Lisinopril)  Past History:  Past Medical History: Last updated: 04/18/2009 Hypertension Osteopenia Carotid vascular disease (stenosis) angioedema-ACE inhibitor allergic reaction to pneumovax 10/03 Obesity Arthritis  cerebrovascular disease with right brain stroke/left hemiparesis  Past Surgical History: Last updated: 10/20/2007 Hysterectomy (no ca) Oophorectomy-unilateral-1963 Bunionectomy-1986 D&C-1962 [id number]  Family History: Last updated: 10/24/2007 Family History of Heart Disease: Father, Mother No FH of Colon Cancer:  Social History: Last updated: 10/24/2007 Retired Married Former Smoker Alcohol Use - yes Daily Caffeine Use  Risk Factors: Smoking Status: never (06/10/2009)  Physical Exam  General:  alert and well-developed.   Head:  normocephalic and atraumatic.   Eyes:  pupils equal and pupils round.   Ears:  R ear normal and L ear normal.   Neck:  No deformities, masses, or tenderness noted. Chest Wall:  Symmetrical;  no deformities or tenderness. Lungs:  normal respiratory effort and no accessory muscle use.   Heart:  normal rate and regular rhythm.   Abdomen:  soft and non-tender.   Msk:  No deformity or scoliosis noted of thoracic or lumbar spine.   Neurologic:  cranial nerves II-XII intact.  broad based gait   Impression & Recommendations:  Problem # 1:  STROKE (ICD-434.91) she has done very well I've encouraged her to get back to her normal acitivities she has very little functional deficit continue meds  Her updated medication list for this problem includes:    Aggrenox 25-200 Mg Cp12 (Aspirin-dipyridamole) .Marland Kitchen... Take 1 capsule by mouth twice a day  Problem # 2:  HYPERTENSION (ICD-401.9) as well contrtolled as ever stay on same meds Her updated medication list for this problem includes:    Clonidine Hcl 0.2 Mg  Tabs (Clonidine hcl) .Marland Kitchen... 1/2 three times daily.    Hydrochlorothiazide 25 Mg Tabs (Hydrochlorothiazide) .Marland Kitchen... Take 1/2 tab by mouth every morning as needed    Diltiazem Hcl Cr 180 Mg Cp24 (Diltiazem hcl) .Marland Kitchen... Take 1  tablet by mouth two times a day    Avapro 150 Mg Tabs (Irbesartan) .Marland Kitchen... Take 1 tablet by mouth two times a day    Bystolic 20 Mg Tabs (Nebivolol hcl) .Marland Kitchen... 1 by mouth daily  BP today: 112/48 Prior BP: 160/56 (05/12/2009)  Labs Reviewed: K+: 4.2 (04/02/2009) Creat: : 1.0 (04/02/2009)   Chol: 163 (04/02/2009)   HDL: 57.90 (04/02/2009)   LDL: 85 (04/02/2009)   TG: 102.0 (04/02/2009)  Complete Medication List: 1)  Aggrenox 25-200 Mg Cp12 (Aspirin-dipyridamole) .... Take 1 capsule by mouth twice a day 2)  Tums 500 Mg Chew (Calcium carbonate antacid) .... One once daily 3)  Lomotil 2.5-0.025 Mg Tabs (Diphenoxylate-atropine) .Marland KitchenMarland KitchenMarland Kitchen  Once daily as needed 4)  Centrum Silver Tabs (Multiple vitamins-minerals) .... Once daily 5)  Benadryl 25 Mg Caps (Diphenhydramine hcl) .... Take 1 tablet by mouth at bedtime--if off xyzal can take every 4-6 hours as needed 6)  Clonidine Hcl 0.2 Mg Tabs (Clonidine hcl) .... 1/2 three times daily. 7)  Epipen 2-pak 0.3 Mg/0.79ml Devi (Epinephrine) .... Use as directed as needed 8)  Hydrochlorothiazide 25 Mg Tabs (Hydrochlorothiazide) .... Take 1/2 tab by mouth every morning as needed 9)  Diltiazem Hcl Cr 180 Mg Cp24 (Diltiazem hcl) .... Take 1  tablet by mouth two times a day 10)  Avapro 150 Mg Tabs (Irbesartan) .... Take 1 tablet by mouth two times a day 11)  Bystolic 20 Mg Tabs (Nebivolol hcl) .Marland Kitchen.. 1 by mouth daily 12)  Ativan 0.5 Mg Tabs (Lorazepam) .Marland Kitchen.. 1 q 6 hours as needed anxiety  Patient Instructions: 1)  Please schedule a follow-up appointment in 2 months.

## 2010-03-03 NOTE — Progress Notes (Signed)
Summary: ellen please call  Phone Note Call from Patient Call back at Home Phone [phone]   Caller: Patient Call For: Birdie Sons MD Summary of Call: pt would like ellen to return her call concerning avapro. Initial call taken by: Heron Sabins,  Jun 11, 2009 1:24 PM  Follow-up for Phone Call        pt was questioning meds--I told her to follow them as directed on check out sheet from yesterday. Follow-up by: Gladis Riffle, RN,  Jun 11, 2009 1:35 PM

## 2010-03-03 NOTE — Progress Notes (Signed)
Summary: weak & drowsy  Phone Note Call from Patient   Summary of Call: Discuss drowsy & weak.   She is not having any symptoms of disorientation, confusion, paralysis, memory loss, SOB, pain.  Son there.  Has Tues appt Dr Cato Mulligan.  Will try to get out with son & some special attention.  She notes she is on many meds that make her drowsy & weak.   Appt declined at this time after our discuss.  She will call back as needed.    Initial call taken by: Rudy Jew, RN,  April 18, 2009 12:41 PM  Follow-up for Phone Call        pt cb to req appt 04-18-2009 3.30pm with dr Kirtland Bouchard Follow-up by: Heron Sabins,  April 18, 2009 12:54 PM

## 2010-03-03 NOTE — Assessment & Plan Note (Signed)
Summary: 3wk rov/njr//pt rescd from bump//ccm   Vital Signs:  Patient profile:   77 year old female Weight:      154 pounds Temp:     98 degrees F oral Pulse rate:   62 / minute Pulse rhythm:   irregular Resp:     12 per minute BP sitting:   160 / 60  (left arm) Cuff size:   regular  Vitals Entered By: Gladis Riffle, RN (April 07, 2009 11:23 AM) CC: 3 WEEK ROV. pt has improved per son Is Patient Diabetic? No   Primary Care Provider:  Birdie Sons, M.D.  CC:  3 WEEK ROV. pt has improved per son.  History of Present Illness: Stroke--feeling much better no headache tolerating meds without difficulty no new neurologic deficits speech has improved motor control of hand and leg have improved  All other systems reviewed and were negative   Preventive Screening-Counseling & Management  Alcohol-Tobacco     Smoking Status: quit > 6 months     Year Started: 1950     Year Quit: 1954  Current Medications (verified): 1)  Aggrenox 25-200 Mg  Cp12 (Aspirin-Dipyridamole) .... Take 1 Capsule By Mouth Twice A Day 2)  Tums 500 Mg Chew (Calcium Carbonate Antacid) .... One Once Daily 3)  Lomotil 2.5-0.025 Mg Tabs (Diphenoxylate-Atropine) .... Once Daily As Needed 4)  Centrum Silver   Tabs (Multiple Vitamins-Minerals) .... Once Daily 5)  Benadryl 25 Mg  Caps (Diphenhydramine Hcl) .... Take 1 Tablet By Mouth At Bedtime--If Off Xyzal Can Take Every 4-6 Hours As Needed 6)  Clonidine Hcl 0.2 Mg Tabs (Clonidine Hcl) .... 1/2 By Mouth Three Times A Day 7)  Triamcinolone Acetonide 0.1 % Crea (Triamcinolone Acetonide) .... Use Twice Daily As Needed For Itching 8)  Xyzal 5 Mg Tabs (Levocetirizine Dihydrochloride) .... 1/2-1 Evry Hs As Needed 9)  Epipen 2-Pak 0.3 Mg/0.69ml Devi (Epinephrine) .... Use As Directed As Needed 10)  Hydrochlorothiazide 25 Mg  Tabs (Hydrochlorothiazide) .... Take 1/2 Tab By Mouth Every Morning As Needed 11)  Diltiazem Hcl Cr 180 Mg Cp24 (Diltiazem Hcl) .... Take 1  Tablet By  Mouth Two Times A Day 12)  Avapro 150 Mg Tabs (Irbesartan) .... Take 1 Tablet By Mouth Two Times A Day 13)  Acetaminophen 500 Mg Caps (Acetaminophen) .... Three Times A Day For 7 Days  Allergies: 1)  ! Ace Inhibitors 2)  ! Felodipine (Felodipine) 3)  ! Famotidine (Famotidine) 4)  ! Fexofenadine Hcl (Fexofenadine Hcl) 5)  ! Metoprolol Tartrate (Metoprolol Tartrate) 6)  Penicillin V Potassium (Penicillin V Potassium) 7)  Pneumovax 23 (Pneumococcal Vac Polyvalent) 8)  Prinivil (Lisinopril)  Past History:  Past Medical History: Last updated: 10/24/2007 Hypertension Osteopenia Carotid vascular disease (stenosis) angioedema-ACE inhibitor allergic reaction to pneumovax 10/03 Obesity Arthritis  Past Surgical History: Last updated: 10/20/2007 Hysterectomy (no ca) Oophorectomy-unilateral-1963 Bunionectomy-1986 D&C-1962 [id number]  Family History: Last updated: 10/24/2007 Family History of Heart Disease: Father, Mother No FH of Colon Cancer:  Social History: Last updated: 10/24/2007 Retired Married Former Smoker Alcohol Use - yes Daily Caffeine Use  Risk Factors: Smoking Status: quit > 6 months (04/07/2009)  Physical Exam  General:  alert and well-developed.   Head:  normocephalic and atraumatic.   Eyes:  pupils equal and pupils round.   Ears:  R ear normal and L ear normal.   Neck:  No deformities, masses, or tenderness noted. Chest Wall:  Symmetrical;  no deformities or tenderness. Lungs:  normal respiratory effort and no  accessory muscle use.   Heart:  normal rate and regular rhythm.   Abdomen:  Bowel sounds positive,abdomen soft and non-tender without masses, organomegaly or hernias noted. Neurologic:  minimally slurred speech strength 4 out of 5 arm and leg on left gait much better---narrow based Skin:  turgor normal and color normal.   Psych:  good eye contact and not anxious appearing.     Impression & Recommendations:  Problem # 1:  STROKE  (ICD-434.91) improved needs PT---schedule sooner than next week---I'll call Her updated medication list for this problem includes:    Aggrenox 25-200 Mg Cp12 (Aspirin-dipyridamole) .Marland Kitchen... Take 1 capsule by mouth twice a day  Problem # 2:  HYPERTENSION (ICD-401.9) recheck bp 138/60 Her updated medication list for this problem includes:    Clonidine Hcl 0.2 Mg Tabs (Clonidine hcl) .Marland Kitchen... 1/2 by mouth three times a day    Hydrochlorothiazide 25 Mg Tabs (Hydrochlorothiazide) .Marland Kitchen... Take 1/2 tab by mouth every morning as needed    Diltiazem Hcl Cr 180 Mg Cp24 (Diltiazem hcl) .Marland Kitchen... Take 1  tablet by mouth two times a day    Avapro 150 Mg Tabs (Irbesartan) .Marland Kitchen... Take 1 tablet by mouth two times a day  BP today: 160/60 Prior BP: 142/70 (04/02/2009)  Labs Reviewed: K+: 4.2 (04/02/2009) Creat: : 1.0 (04/02/2009)   Chol: 163 (04/02/2009)   HDL: 57.90 (04/02/2009)   LDL: 85 (04/02/2009)   TG: 102.0 (04/02/2009)  Problem # 3:  HEART MURMUR, SYSTOLIC (ICD-785.2) stable  Complete Medication List: 1)  Aggrenox 25-200 Mg Cp12 (Aspirin-dipyridamole) .... Take 1 capsule by mouth twice a day 2)  Tums 500 Mg Chew (Calcium carbonate antacid) .... One once daily 3)  Lomotil 2.5-0.025 Mg Tabs (Diphenoxylate-atropine) .... Once daily as needed 4)  Centrum Silver Tabs (Multiple vitamins-minerals) .... Once daily 5)  Benadryl 25 Mg Caps (Diphenhydramine hcl) .... Take 1 tablet by mouth at bedtime--if off xyzal can take every 4-6 hours as needed 6)  Clonidine Hcl 0.2 Mg Tabs (Clonidine hcl) .... 1/2 by mouth three times a day 7)  Triamcinolone Acetonide 0.1 % Crea (Triamcinolone acetonide) .... Use twice daily as needed for itching 8)  Xyzal 5 Mg Tabs (Levocetirizine dihydrochloride) .... 1/2-1 evry hs as needed 9)  Epipen 2-pak 0.3 Mg/0.32ml Devi (Epinephrine) .... Use as directed as needed 10)  Hydrochlorothiazide 25 Mg Tabs (Hydrochlorothiazide) .... Take 1/2 tab by mouth every morning as needed 11)  Diltiazem  Hcl Cr 180 Mg Cp24 (Diltiazem hcl) .... Take 1  tablet by mouth two times a day 12)  Avapro 150 Mg Tabs (Irbesartan) .... Take 1 tablet by mouth two times a day  Patient Instructions: 1)  3-4 weeks

## 2010-03-03 NOTE — Progress Notes (Signed)
Summary: question from pharmacy  Phone Note From Pharmacy Call back at [phone]   Caller: [facility]*--via fax Call For: swords  Summary of Call: avapro 150mg  Take 1 tablet by mouth two times a day requires two prior authorizations--one for the drug and one for the two times a day dosing OR will cover benicar, diovan, or losartan. Pls asvise Initial call taken by: Gladis Riffle, RN,  Jun 11, 2009 3:20 PM  Follow-up for Phone Call        [facility] and spoke with Irving Burton... She adv that they have faxed the PA forms to [facility] but if medication is changed to benicar, diovan, or losartan then the 2 PA's will not have to be done...?  Follow-up by: Debbra Riding,  Jun 11, 2009 5:31 PM  Additional Follow-up for Phone Call Additional follow up Details #1::        give samples for now Additional Follow-up by: Birdie Sons MD,  Jun 12, 2009 8:24 AM    Additional Follow-up for Phone Call Additional follow up Details #2::    samples done.  Next appt for 2 months at july 12.2011 Follow-up by: Gladis Riffle, RN,  Jun 12, 2009 10:19 AM

## 2010-03-03 NOTE — Assessment & Plan Note (Signed)
Summary: difficulty walking/et   Vital Signs:  Patient profile:   77 year old female Pulse rate:   60 / minute Pulse rhythm:   irregular Resp:     12 per minute BP sitting:   142 / 70  (left arm) Cuff size:   regular  Vitals Entered By: Gladis Riffle, RN (April 02, 2009 9:18 AM) CC: c/o inability to walk 2/27 until 9:30 AM 3/1--sonstates she fell off toilet 2/28 and hit head--does not remember talking with me yesterday and c/o feeling lightheaded Is Patient Diabetic? No   Primary Care Provider:  Birdie Sons, M.D.  CC:  c/o inability to walk 2/27 until 9:30 AM 3/1--sonstates she fell off toilet 2/28 and hit head--does not remember talking with me yesterday and c/o feeling lightheaded.  History of Present Illness: Larey Seat on Monday hit right side of head on tile bathroom floor since then son and husband say "she hasn't been herself"---decreased memory.  son reports that "she is favoring her right arm and slurring speech"  Pt . 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 One By Mouth Daily 2)  Tums 500 Mg Chew (Calcium Carbonate Antacid) .... One Once Daily 3)  Lomotil 2.5-0.025 Mg Tabs (Diphenoxylate-Atropine) .... Once Daily As Needed 4)  Centrum Silver   Tabs (Multiple Vitamins-Minerals) .... Once Daily 5)  Benadryl 25 Mg  Caps (Diphenhydramine Hcl) .... Take 1 Tablet By Mouth At Bedtime--If Off Xyzal Can Take Every  4-6 Hours As Needed 6)  Clonidine Hcl 0.2 Mg Tabs (Clonidine Hcl) .... 1/2 By Mouth Three Times A Day 7)  Triamcinolone Acetonide 0.1 % Crea (Triamcinolone Acetonide) .... Use Twice Daily As Needed For Itching 8)  Xyzal 5 Mg Tabs (Levocetirizine Dihydrochloride) .... 1/2-1 Evry Hs As Needed 9)  Epipen 2-Pak 0.3 Mg/0.103ml Devi (Epinephrine) .... Use As Directed As Needed 10)  Hydrochlorothiazide 25 Mg  Tabs (Hydrochlorothiazide) .... Take 1/2 Tab By Mouth Every Morning As Needed 11)  Diltiazem Hcl Cr 180 Mg Cp24 (Diltiazem Hcl) .... Take 1  Tablet By Mouth Two Times A Day 12)  Avapro 150 Mg Tabs (Irbesartan) .... Take 1 Tablet By Mouth Two Times A Day  Allergies: 1)  ! Ace Inhibitors 2)  ! Felodipine (Felodipine) 3)  ! Famotidine (Famotidine) 4)  ! Fexofenadine Hcl (Fexofenadine Hcl) 5)  ! Metoprolol Tartrate (Metoprolol Tartrate) 6)  Penicillin V Potassium (Penicillin V Potassium) 7)  Pneumovax 23 (Pneumococcal Vac Polyvalent) 8)  Prinivil (Lisinopril)  Past History:  Past Medical History: Last updated: 10/24/2007 Hypertension Osteopenia Carotid vascular disease (stenosis) angioedema-ACE inhibitor allergic reaction to pneumovax 10/03 Obesity Arthritis  Past Surgical History: Last updated: 10/20/2007 Hysterectomy (no ca) Oophorectomy-unilateral-1963 Bunionectomy-1986 D&C-1962 [id number]  Family History: Last updated: 10/24/2007 Family History of Heart Disease: Father, Mother No FH of Colon Cancer:  Social History: Last updated: 10/24/2007 Retired Married Former Smoker Alcohol Use - yes Daily Caffeine Use  Risk Factors: Smoking Status: quit > 6 months (04/02/2009)  Physical Exam  General:  well-developed and well-nourished.   Head:  normocephalic and atraumatic.   Eyes:  pupils equal and pupils round.   Mouth:  left facdial droop Neck:  No deformities, masses, or tenderness noted. Lungs:  normal respiratory effort and no intercostal retractions.   Heart:   normal rate and regular rhythm.   Abdomen:  Bowel sounds positive,abdomen soft and non-tender without masses, organomegaly or hernias noted. Msk:  No deformity or scoliosis noted of thoracic or lumbar spine.   Neurologic:  alert oriented to name, my name, place, month  holding left arm straight strength l arm normal rapid alternating motions of left hand diminished   Impression & Recommendations:  Problem # 1:  STROKE (ICD-434.91)  I'm convinced she has had stroke, need to evaluate for hemorrhagic stroke/subdural hematoma discussed with patient and son may need hospitalization but I can do CT scan quicker as outpatient  Her updated medication list for this problem includes:    Aggrenox 25-200 Mg Cp12 (Aspirin-dipyridamole) .Marland Kitchen... Take 1 capsule by mouth twice a day  Orders: Radiology Referral (Radiology) Venipuncture [phone]) Physical Therapy Referral (PT) Speech Therapy (Speech Therapy) Radiology Referral (Radiology) TLB-Lipid Panel (80061-LIPID) TLB-BMP (Basic Metabolic Panel-BMET) (80048-METABOL) TLB-CBC Platelet - w/Differential (85025-CBCD) TLB-Hepatic/Liver Function Pnl (80076-HEPATIC) TLB-TSH (Thyroid Stimulating Hormone) (84443-TSH) later , discussed with Dr. Chestine Spore and dr dohmeier she is 3 days out fromstroke---nothing to do in hospital needs mri/mra aggrenox---side effects discussed  all of the abve discussed with patient and son.  they will call tomorrow  Complete Medication List: 1)  Aggrenox 25-200 Mg Cp12 (Aspirin-dipyridamole) .... Take 1 capsule by mouth twice a day 2)  Tums 500 Mg Chew (Calcium carbonate antacid) .... One once daily 3)  Lomotil 2.5-0.025 Mg Tabs (Diphenoxylate-atropine) .... Once daily as needed 4)  Centrum Silver Tabs (Multiple vitamins-minerals) .... Once daily 5)  Benadryl 25 Mg Caps (Diphenhydramine hcl) .... Take 1 tablet by mouth at bedtime--if off xyzal can take every 4-6 hours as needed 6)  Clonidine Hcl 0.2 Mg Tabs (Clonidine  hcl) .... 1/2 by mouth three times a day 7)  Triamcinolone Acetonide 0.1 % Crea (Triamcinolone acetonide) .... Use twice daily as needed for itching 8)  Xyzal 5 Mg Tabs (Levocetirizine dihydrochloride) .... 1/2-1 evry hs as needed 9)  Epipen 2-pak 0.3 Mg/0.19ml Devi (Epinephrine) .... Use as directed as needed 10)  Hydrochlorothiazide 25 Mg Tabs (Hydrochlorothiazide) .... Take 1/2 tab by mouth every morning as needed 11)  Diltiazem Hcl Cr 180 Mg Cp24 (Diltiazem hcl) .... Take 1  tablet by mouth two times a day 12)  Avapro 150 Mg Tabs (Irbesartan) .... Take 1 tablet by mouth two times a day 13)  Acetaminophen 500 Mg Caps (Acetaminophen) .... Three times a day for 7 days  Patient Instructions: 1)  see me next week Prescriptions: AGGRENOX 25-200 MG  CP12 (ASPIRIN-DIPYRIDAMOLE) Take 1 capsule by mouth twice a day  #60 x 3   Entered and Authorized by:   Birdie Sons MD   Signed by:   Birdie Sons MD on 04/02/2009   Method used:   Electronically to        [facility]* (retail)       [address]       [place], [place]  [id number]       Ph: [phone]       Fax: [phone]   RxID:   [id number]

## 2010-03-03 NOTE — Progress Notes (Signed)
Summary: please advise  Phone Note Call from Patient Call back at Home Phone [phone]   Caller: Patient-----voicemail Summary of Call: Has an appt on 04-09-2009. This past weekend, her legs gave out and could not walk. Wants the nurse to call her. Please advise. Initial call taken by: Warnell Forester,  April 01, 2009 9:10 AM  Follow-up for Phone Call        states cannot walk since 2/27  until about 9:30 this AM.  Has no other sxs.  Has appt 3/9. Follow-up by: Gladis Riffle, RN,  April 01, 2009 11:13 AM  Additional Follow-up for Phone Call Additional follow up Details #1::        appt tomorrow per dr Alfons Sulkowski.Patient notified. appt made Additional Follow-up by: Gladis Riffle, RN,  April 01, 2009 11:27 AM

## 2010-03-03 NOTE — Progress Notes (Signed)
Summary: stopped aggrenox  Phone Note Call from Patient   Caller: Patient Call For: Birdie Sons MD Summary of Call: Pt stopped taking all her meds this weekend and added Aggrenox back and found out this is the one that is making her nauseated.  She has stopped this one, wants to know what Dr. Cato Mulligan thinks. [phone]  Initial call taken by: Lynann Beaver CMA,  Jun 17, 2009 10:02 AM  Follow-up for Phone Call        resume medications. tylenol 500 mg by mouth three times a day for two weeks aggrenox 1 dialy for 1 week and then two times a day . call if sxs recur Follow-up by: Birdie Sons MD,  Jun 17, 2009 3:01 PM  Additional Follow-up for Phone Call Additional follow up Details #1::        Pt. notified. Additional Follow-up by: Lynann Beaver CMA,  Jun 17, 2009 3:05 PM

## 2010-03-03 NOTE — Assessment & Plan Note (Signed)
Summary: weakness/sob/njr   Vital Signs:  Patient profile:   77 year old female Weight:      153 pounds Temp:     98.1 degrees F oral BP sitting:   200 / 64  (left arm) Cuff size:   regular  Vitals Entered By: Duard Brady LPN (April 18, 2009 3:40 PM) CC: stroke 2 weeks ago - today c/o (L) leg 'not working' , confusion, nervous -BP in (R) arm 150/70 , has f/u with Dr. Cato Mulligan next tuesday Is Patient Diabetic? No   Primary Care Provider:  Birdie Sons, M.D.  CC:  stroke 2 weeks ago - today c/o (L) leg 'not working' , confusion, nervous -BP in (R) arm 150/70 , and has f/u with Dr. Cato Mulligan next tuesday.  History of Present Illness: 77 year old patient who has a history of multi-drug-resistant hypertension who has been diagnosed with a recent right basal ganglia stroke.  Over  the past two weeks, she has had the nice improvement.  Complaints include some occasional intermittent confusion, but her left-sided weakness continues to improve.  She has had a MRI, MRA, and recent laboratory studies.  Her blood pressure medication has been adjusted recently, but her Catapres has been down titrated with a recent addition. Results imaging studies and laboratory studies reviewed  Preventive Screening-Counseling & Management  Alcohol-Tobacco     Smoking Status: never  Allergies: 1)  ! Ace Inhibitors 2)  ! Felodipine (Felodipine) 3)  ! Famotidine (Famotidine) 4)  ! Fexofenadine Hcl (Fexofenadine Hcl) 5)  ! Metoprolol Tartrate (Metoprolol Tartrate) 6)  Penicillin V Potassium (Penicillin V Potassium) 7)  Pneumovax 23 (Pneumococcal Vac Polyvalent) 8)  Prinivil (Lisinopril)  Past History:  Past Medical History: Hypertension Osteopenia Carotid vascular disease (stenosis) angioedema-ACE inhibitor allergic reaction to pneumovax 10/03 Obesity Arthritis  cerebrovascular disease with right brain stroke/left hemiparesis  Social History: Smoking Status:  never  Review of Systems   The patient complains of muscle weakness and difficulty walking.  The patient denies anorexia, fever, weight loss, weight gain, vision loss, decreased hearing, hoarseness, chest pain, syncope, dyspnea on exertion, peripheral edema, prolonged cough, headaches, hemoptysis, abdominal pain, melena, hematochezia, severe indigestion/heartburn, hematuria, incontinence, genital sores, suspicious skin lesions, transient blindness, depression, unusual weight change, abnormal bleeding, enlarged lymph nodes, angioedema, breast masses, and testicular masses.    Physical Exam  General:  elderly alert, no distress.  She is ambulatory  without assistance Head:  Normocephalic and atraumatic without obvious abnormalities. No apparent alopecia or balding. Eyes:  No corneal or conjunctival inflammation noted. EOMI. Perrla. Funduscopic exam benign, without hemorrhages, exudates or papilledema. Vision grossly normal. Mouth:  Oral mucosa and oropharynx without lesions or exudates.  Teeth in good repair. Neck:  bilateral loud carotid bruits Lungs:  Normal respiratory effort, chest expands symmetrically. Lungs are clear to auscultation, no crackles or wheezes. Heart:  grade 3/6 systolic murmur at the primary aortic area Abdomen:  Bowel sounds positive,abdomen soft and non-tender without masses, organomegaly or hernias noted. Msk:  No deformity or scoliosis noted of thoracic or lumbar spine.   Extremities:  No clubbing, cyanosis, edema, or deformity noted with normal full range of motion of all joints.   Neurologic:  alert & oriented X3 and cranial nerves II-XII intact.   normal speech; mild confusion unable to name the date or do serial sevens mild left hemi-paresis with  dyspraxia able to transfer to the examining table  and step up using her left leg   Impression & Recommendations:  Problem # 1:  STROKE (ICD-434.91)  Her updated medication list for this problem includes:    Aggrenox 25-200 Mg Cp12  (Aspirin-dipyridamole) .Marland Kitchen... Take 1 capsule by mouth twice a day  Problem # 2:  HEART MURMUR, SYSTOLIC (ICD-785.2)  Problem # 3:  HYPERTENSION (ICD-401.9)  Her updated medication list for this problem includes:    Clonidine Hcl 0.2 Mg Tabs (Clonidine hcl) ..... One  by mouth three times a day    Hydrochlorothiazide 25 Mg Tabs (Hydrochlorothiazide) .Marland Kitchen... Take 1/2 tab by mouth every morning as needed    Diltiazem Hcl Cr 180 Mg Cp24 (Diltiazem hcl) .Marland Kitchen... Take 1  tablet by mouth two times a day    Avapro 150 Mg Tabs (Irbesartan) .Marland Kitchen... Take 1 tablet by mouth two times a day patient's systolic blood pressure are still in the 190 to 200 range bilaterally.  Will increase her clonidine to 0.2 t.i.d..  Will recheck in 5 days as scheduled  Complete Medication List: 1)  Aggrenox 25-200 Mg Cp12 (Aspirin-dipyridamole) .... Take 1 capsule by mouth twice a day 2)  Tums 500 Mg Chew (Calcium carbonate antacid) .... One once daily 3)  Lomotil 2.5-0.025 Mg Tabs (Diphenoxylate-atropine) .... Once daily as needed 4)  Centrum Silver Tabs (Multiple vitamins-minerals) .... Once daily 5)  Benadryl 25 Mg Caps (Diphenhydramine hcl) .... Take 1 tablet by mouth at bedtime--if off xyzal can take every 4-6 hours as needed 6)  Clonidine Hcl 0.2 Mg Tabs (Clonidine hcl) .... One  by mouth three times a day 7)  Epipen 2-pak 0.3 Mg/0.83ml Devi (Epinephrine) .... Use as directed as needed 8)  Hydrochlorothiazide 25 Mg Tabs (Hydrochlorothiazide) .... Take 1/2 tab by mouth every morning as needed 9)  Diltiazem Hcl Cr 180 Mg Cp24 (Diltiazem hcl) .... Take 1  tablet by mouth two times a day 10)  Avapro 150 Mg Tabs (Irbesartan) .... Take 1 tablet by mouth two times a day  Patient Instructions: 1)  Limit your Sodium (Salt). 2)  It is important that you exercise regularly at least 20 minutes 5 times a week. If you develop chest pain, have severe difficulty breathing, or feel very tired , stop exercising immediately and seek medical  attention. 3)  return in 4 days for follow-up as scheduled 4)  increase Catapres to one tablet 3 times daily

## 2010-03-03 NOTE — Assessment & Plan Note (Signed)
Summary: 2 WK ROV/NJR/PT RSC/CJR   Vital Signs:  Patient profile:   77 year old female Weight:      148 pounds Pulse rate:   58 / minute Pulse rhythm:   regular Resp:     12 per minute BP sitting:   160 / 56  (left arm) Cuff size:   regular  Vitals Entered By: Gladis Riffle, RN (May 12, 2009 11:47 AM) CC: 2 week rov--saw Dr Clent Ridges lat week and put on ativan--BP 160/60 at home Is Patient Diabetic? No   Primary Care Provider:  Birdie Sons, M.D.  CC:  2 week rov--saw Dr Clent Ridges lat week and put on ativan--BP 160/60 at home.  History of Present Illness: Stroke still with some LLE "slowness" she went to therapy one time and stopped because therapist told her she was 100%.   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 Take 1 Capsule By Mouth Twice A Day 2)  Tums 500 Mg Chew (Calcium Carbonate Antacid) .... One Once Daily 3)  Lomotil 2.5-0.025 Mg Tabs (Diphenoxylate-Atropine) .... Once Daily As Needed 4)  Centrum Silver   Tabs (Multiple Vitamins-Minerals) .... Once Daily 5)  Benadryl 25 Mg  Caps (Diphenhydramine Hcl) .... Take 1 Tablet By Mouth At Bedtime--If Off Xyzal Can Take Every 4-6 Hours As Needed 6)  Clonidine Hcl 0.2 Mg Tabs (Clonidine Hcl) .... 1/2 Three Times Daily. 7)  Epipen 2-Pak 0.3 Mg/0.51ml Devi (Epinephrine)  .... Use As Directed As Needed 8)  Hydrochlorothiazide 25 Mg  Tabs (Hydrochlorothiazide) .... Take 1/2 Tab By Mouth Every Morning As Needed 9)  Diltiazem Hcl Cr 180 Mg Cp24 (Diltiazem Hcl) .... Take 1  Tablet By Mouth Two Times A Day 10)  Avapro 150 Mg Tabs (Irbesartan) .... Take 1 Tablet By Mouth Two Times A Day 11)  Bystolic 10 Mg  Tabs (Nebivolol Hcl) .Marland Kitchen.. 1 By Mouth Daily 12)  Ativan 0.5 Mg Tabs (Lorazepam) .Marland Kitchen.. 1 Q 6 Hours As Needed Anxiety  Allergies: 1)  ! Ace Inhibitors 2)  ! Felodipine (Felodipine) 3)  ! Famotidine (Famotidine) 4)  ! Fexofenadine Hcl (Fexofenadine Hcl) 5)  ! Metoprolol Tartrate (Metoprolol Tartrate) 6)  Penicillin V Potassium (Penicillin V Potassium) 7)  Pneumovax 23 (Pneumococcal Vac Polyvalent) 8)  Prinivil (Lisinopril)  Physical Exam  General:  alert and well-developed.   Head:  normocephalic and atraumatic.   Eyes:  pupils equal and pupils round.   Ears:  R ear normal and L ear normal.   Neck:  No deformities, masses, or tenderness noted. Lungs:  normal respiratory effort and no intercostal retractions.   Heart:  normal rate and regular rhythm.   Abdomen:  soft and non-tender.   Neurologic:  speech normal except minimally deliberate   Impression & Recommendations:  Problem # 1:  STROKE (ICD-434.91) BP too high Her updated  medication list for this problem includes:    Aggrenox 25-200 Mg Cp12 (Aspirin-dipyridamole) .Marland Kitchen... Take 1 capsule by mouth twice a day  Problem # 2:  HYPERTENSION (ICD-401.9) not controlled continue current medications plus additional meds Her updated medication list for this problem includes:    Clonidine Hcl 0.2 Mg Tabs (Clonidine hcl) .Marland Kitchen... 1/2 three times daily.    Hydrochlorothiazide 25 Mg Tabs (Hydrochlorothiazide) .Marland Kitchen... Take 1/2 tab by mouth every morning as needed    Diltiazem Hcl Cr 180 Mg Cp24 (Diltiazem hcl) .Marland Kitchen... Take 1  tablet by mouth two times a day    Avapro 150 Mg Tabs (Irbesartan) .Marland Kitchen... Take 1 tablet by  mouth two times a day    Bystolic 20 Mg Tabs (Nebivolol hcl) .Marland Kitchen... 1 by mouth daily  Complete Medication List: 1)  Aggrenox 25-200 Mg Cp12 (Aspirin-dipyridamole) .... Take 1 capsule by mouth twice a day 2)  Tums 500 Mg Chew (Calcium carbonate antacid) .... One once daily 3)  Lomotil 2.5-0.025 Mg Tabs (Diphenoxylate-atropine) .... Once daily as needed 4)  Centrum Silver Tabs (Multiple vitamins-minerals) .... Once daily 5)  Benadryl 25 Mg Caps (Diphenhydramine hcl) .... Take 1 tablet by mouth at bedtime--if off xyzal can take every 4-6 hours as needed 6)  Clonidine Hcl 0.2 Mg Tabs (Clonidine hcl) .... 1/2 three times daily. 7)  Epipen 2-pak 0.3 Mg/0.50ml Devi (Epinephrine) .... Use as directed as needed 8)  Hydrochlorothiazide 25 Mg Tabs (Hydrochlorothiazide) .... Take 1/2 tab by mouth every morning as needed 9)  Diltiazem Hcl Cr 180 Mg Cp24 (Diltiazem hcl) .... Take 1  tablet by mouth two times a day 10)  Avapro 150 Mg Tabs (Irbesartan) .... Take 1 tablet by mouth two times a day 11)  Bystolic 20 Mg Tabs (Nebivolol hcl) .Marland Kitchen.. 1 by mouth daily 12)  Ativan 0.5 Mg Tabs (Lorazepam) .Marland Kitchen.. 1 q 6 hours as needed anxiety  Patient Instructions: 1)  Please schedule a follow-up appointment in 1 month. Prescriptions: BYSTOLIC 20 MG TABS (NEBIVOLOL HCL) 1 by mouth daily  #90 x 3   Entered and Authorized by:   Birdie Sons MD   Signed by:   Birdie Sons MD on 05/12/2009   Method used:   Samples Given   RxID:   [id number]

## 2010-03-03 NOTE — Progress Notes (Signed)
Summary: FYI  Phone Note Call from Patient   Caller: Spouse Call For: Birdie Sons MD Summary of Call: Pt's husband called to see if pt could be admitted for diarrhea, and his inability to care for her.  Discussed the only option tonight for possible admission would be ER evaluation.  He feels he needs to take her to the hospital.  No increased CVA symptoms or weakness....just new onset of diarrhea. Initial call taken by: Lynann Beaver CMA,  April 03, 2009 4:53 PM  Follow-up for Phone Call        agree Follow-up by: Birdie Sons MD,  April 04, 2009 11:48 AM

## 2010-03-11 NOTE — Assessment & Plan Note (Signed)
Summary: fup//ccm   Vital Signs:  Patient profile:   77 year old female Weight:      139 pounds Temp:     98.7 degrees F oral Pulse rate:   44 / minute Pulse rhythm:   regular BP sitting:   156 / 98  (left arm) Cuff size:   regular  Vitals Entered By: Alfred Levins, CMA (March 03, 2010 11:58 AM) CC: bp check   Primary Care Provider:  Birdie Sons, M.D.  CC:  bp check.  History of Present Illness:  Follow-Up Visit      This is an 77 year old woman who presents for Follow-up visit.  The patient denies chest pain and palpitations.  Since the last visit the patient notes no new problems or concerns.  The patient reports taking meds as prescribed r.  When questioned about possible medication side effects, the patient notes none.   she has some fatigue but is generally feeling well  All other systems reviewed : frustrated that she doesn't have the energy to play golf    Current Medications (verified): 1)  Tums 500 Mg Chew (Calcium Carbonate Antacid) .... One Once Daily 2)  Lomotil 2.5-0.025 Mg Tabs (Diphenoxylate-Atropine) .... Once Daily As Needed 3)  Centrum Silver   Tabs (Multiple Vitamins-Minerals) .... Once Daily 4)  Hydrochlorothiazide 25 Mg  Tabs (Hydrochlorothiazide) .... Take 1/2 Tab By Mouth Every Morning As Needed 5)  Diltiazem Hcl Cr 180 Mg Cp24 (Diltiazem Hcl) .... Take 1  Tablet By Mouth Once Daily 6)  Bystolic 20 Mg Tabs (Nebivolol Hcl) .Marland Kitchen.. 1 By Mouth Daily 7)  Aspirin 325 Mg Tabs (Aspirin) .... Take 1 Tab By Mouth Every Day 8)  Losartan Potassium 100 Mg Tabs (Losartan Potassium) .... Take 1 Tablet By Mouth Once A Day  Allergies (verified): 1)  ! Ace Inhibitors 2)  ! Felodipine (Felodipine) 3)  ! Famotidine (Famotidine) 4)  ! Fexofenadine Hcl (Fexofenadine Hcl) 5)  ! Metoprolol Tartrate (Metoprolol Tartrate) 6)  Penicillin V Potassium (Penicillin V Potassium) 7)  Pneumovax 23 (Pneumococcal Vac Polyvalent) 8)  Prinivil (Lisinopril)  Past History:  Past  Medical History: Last updated: 04/18/2009 Hypertension Osteopenia Carotid vascular disease (stenosis) angioedema-ACE inhibitor allergic reaction to pneumovax 10/03 Obesity Arthritis  cerebrovascular disease with right brain stroke/left hemiparesis  Past Surgical History: Last updated: 10/20/2007 Hysterectomy (no ca) Oophorectomy-unilateral-1963 Bunionectomy-1986 D&C-1962 [id number]  Family History: Last updated: 10/24/2007 Family History of Heart Disease: Father, Mother No FH of Colon Cancer:  Social History: Last updated: 10/24/2007 Retired Married Former Smoker Alcohol Use - yes Daily Caffeine Use  Risk Factors: Smoking Status: never (10/01/2009)  Physical Exam  General:  elderly female in no acute distress. HEENT exam atraumatic, normocephalic eyes her muscles are intact. Neck is supple. Chest clear to auscultation. Cardiac exam S1-S2 are regular. She does have an S4. Extremities no clubbing cyanosis or edema.   Impression & Recommendations:  Problem # 1:  STROKE (ICD-434.91) no recurrence continue ASA risk factor modification Her updated medication list for this problem includes:    Aspirin 325 Mg Tabs (Aspirin) .Marland Kitchen... Take 1 tab by mouth every day  Problem # 2:  HYPERTENSION (ICD-401.9) see new meds needs more aggressive care  Her updated medication list for this problem includes:    Diltiazem Hcl Cr 180 Mg Cp24 (Diltiazem hcl) .Marland Kitchen... Take 1  tablet by mouth once daily    Bystolic 20 Mg Tabs (Nebivolol hcl) .Marland Kitchen... 1 by mouth daily    Benicar Hct 40-25 Mg  Tabs (Olmesartan medoxomil-hctz) .Marland Kitchen... Take 1 tab by mouth daily  BP today: 156/98 Prior BP: 154/74 (12/24/2009)  Labs Reviewed: K+: 4.2 (04/02/2009) Creat: : 1.0 (04/02/2009)   Chol: 163 (04/02/2009)   HDL: 57.90 (04/02/2009)   LDL: 85 (04/02/2009)   TG: 102.0 (04/02/2009)  Complete Medication List: 1)  Tums 500 Mg Chew (Calcium carbonate antacid) .... One once daily 2)  Lomotil 2.5-0.025 Mg Tabs  (Diphenoxylate-atropine) .... Once daily as needed 3)  Centrum Silver Tabs (Multiple vitamins-minerals) .... Once daily 4)  Diltiazem Hcl Cr 180 Mg Cp24 (Diltiazem hcl) .... Take 1  tablet by mouth once daily 5)  Bystolic 20 Mg Tabs (Nebivolol hcl) .Marland Kitchen.. 1 by mouth daily 6)  Aspirin 325 Mg Tabs (Aspirin) .... Take 1 tab by mouth every day 7)  Benicar Hct 40-25 Mg Tabs (Olmesartan medoxomil-hctz) .... Take 1 tab by mouth daily  Patient Instructions: 1)  Please schedule a follow-up appointment in 1 month. Prescriptions: BENICAR HCT 40-25 MG  TABS (OLMESARTAN MEDOXOMIL-HCTZ) Take 1 tab by mouth daily  #90 x 3   Entered and Authorized by:   Birdie Sons MD   Signed by:   Birdie Sons MD on 03/04/2010   Method used:   Samples Given   RxID:   [id number]    Orders Added: 1)  Est. Patient Level IV [14782]

## 2010-03-31 ENCOUNTER — Ambulatory Visit (INDEPENDENT_AMBULATORY_CARE_PROVIDER_SITE_OTHER): Payer: MEDICARE | Admitting: Internal Medicine

## 2010-03-31 DIAGNOSIS — I10 Essential (primary) hypertension: Secondary | ICD-10-CM

## 2010-04-01 NOTE — Progress Notes (Signed)
  Subjective:    Patient ID: [name] [name], female    DOB: [birth year], 77 y.o.   MRN: [id number]  HPI Pt presents to clinic for evaluation of HTN. States since BP medication was changed has felt bad with insomnia and loose stools. ARB and hctz 12.5 qd were consolidated to benicar hct, clonidine dc'ed and bystolic begun. Pt concerned with benicar hct specifically and does not wish to take any further. Wishes to return to hctz 12.5mg  qd. BP rechecked to be 110/70 after initial bp of 94/52. No dizziness or syncope.   Reviewed PMH, medications and allergies.    Review of Systems  Gastrointestinal: Positive for diarrhea. Negative for abdominal pain and constipation.  Neurological: Negative for dizziness, syncope and light-headedness.       Objective:   Physical Exam  [nursing notereviewed. Constitutional: She appears well-developed and well-nourished. No distress.  HENT:  Head: Normocephalic and atraumatic.  Right Ear: External ear normal.  Left Ear: External ear normal.  Nose: Nose normal.  Eyes: Conjunctivae are normal. No scleral icterus.  Neurological: She is alert.  Skin: She is not diaphoretic.          Assessment & Plan:

## 2010-04-01 NOTE — Assessment & Plan Note (Signed)
Pt stopping benicar hct. Resume hctz 12.5mg  qd. Recommend outpt monitoring of bp and reporting of results for review. States understanding. Followup with pmd as scheduled or sooner if sx's do not resolve or continued concerns of BP.

## 2010-04-03 ENCOUNTER — Ambulatory Visit: Payer: Self-pay | Admitting: Internal Medicine

## 2010-04-10 ENCOUNTER — Telehealth: Payer: Self-pay | Admitting: Internal Medicine

## 2010-04-10 DIAGNOSIS — I10 Essential (primary) hypertension: Secondary | ICD-10-CM

## 2010-04-10 MED ORDER — CLONIDINE HCL 0.2 MG PO TABS: 0.2000 mg | ORAL_TABLET | Freq: Two times a day (BID) | ORAL | Status: DC

## 2010-04-10 NOTE — Telephone Encounter (Signed)
Pt called and is req a refill Clonidine 0.2 mg. Pls call in to [facility].   Pt needs fup appt with Dr Cato Mulligan on bp. Pls advise.

## 2010-04-10 NOTE — Telephone Encounter (Signed)
rx sent in electronically 

## 2010-04-30 ENCOUNTER — Inpatient Hospital Stay (HOSPITAL_COMMUNITY)
Admission: EM | Admit: 2010-04-30 | Discharge: 2010-05-01 | DRG: 309 | Disposition: A | Discharge status: Home / Self Care | Payer: MEDICARE | Attending: Internal Medicine | Admitting: Internal Medicine

## 2010-04-30 ENCOUNTER — Telehealth: Payer: Self-pay | Admitting: Internal Medicine

## 2010-04-30 ENCOUNTER — Emergency Department (HOSPITAL_COMMUNITY): Payer: MEDICARE

## 2010-04-30 DIAGNOSIS — Z79899 Other long term (current) drug therapy: Secondary | ICD-10-CM

## 2010-04-30 DIAGNOSIS — Z7982 Long term (current) use of aspirin: Secondary | ICD-10-CM

## 2010-04-30 DIAGNOSIS — F0390 Unspecified dementia without behavioral disturbance: Secondary | ICD-10-CM | POA: Diagnosis present

## 2010-04-30 DIAGNOSIS — R55 Syncope and collapse: Secondary | ICD-10-CM | POA: Diagnosis present

## 2010-04-30 DIAGNOSIS — I498 Other specified cardiac arrhythmias: Principal | ICD-10-CM | POA: Diagnosis present

## 2010-04-30 DIAGNOSIS — I10 Essential (primary) hypertension: Secondary | ICD-10-CM | POA: Diagnosis present

## 2010-04-30 DIAGNOSIS — Z8673 Personal history of transient ischemic attack (TIA), and cerebral infarction without residual deficits: Secondary | ICD-10-CM

## 2010-04-30 DIAGNOSIS — T448X5A Adverse effect of centrally-acting and adrenergic-neuron-blocking agents, initial encounter: Secondary | ICD-10-CM | POA: Diagnosis present

## 2010-04-30 DIAGNOSIS — T46905A Adverse effect of unspecified agents primarily affecting the cardiovascular system, initial encounter: Secondary | ICD-10-CM | POA: Diagnosis present

## 2010-04-30 DIAGNOSIS — Y92009 Unspecified place in unspecified non-institutional (private) residence as the place of occurrence of the external cause: Secondary | ICD-10-CM

## 2010-04-30 DIAGNOSIS — N179 Acute kidney failure, unspecified: Secondary | ICD-10-CM | POA: Diagnosis present

## 2010-04-30 LAB — POCT I-STAT, CHEM 8
Calcium, Ion: 1.14 mmol/L (ref 1.12–1.32)
Chloride: 101 mEq/L (ref 96–112)
Glucose, Bld: 105 mg/dL — ABNORMAL HIGH (ref 70–99)
HCT: 44 % (ref 36.0–46.0)
Hemoglobin: 15 g/dL (ref 12.0–15.0)
Potassium: 4.2 mEq/L (ref 3.5–5.1)

## 2010-04-30 LAB — COMPREHENSIVE METABOLIC PANEL
ALT: 12 U/L (ref 0–35)
AST: 13 U/L (ref 0–37)
Albumin: 3.4 g/dL — ABNORMAL LOW (ref 3.5–5.2)
Alkaline Phosphatase: 50 U/L (ref 39–117)
BUN: 20 mg/dL (ref 6–23)
Chloride: 101 mEq/L (ref 96–112)
GFR calc Af Amer: 49 mL/min — ABNORMAL LOW (ref >60)
Sodium: 140 mEq/L (ref 135–145)
Total Protein: 5.9 g/dL — ABNORMAL LOW (ref 6.0–8.3)

## 2010-04-30 LAB — DIFFERENTIAL
Basophils Absolute: 0.1 10*3/uL (ref 0.0–0.1)
Basophils Relative: 1 % (ref 0–1)
Eosinophils Absolute: 0.1 10*3/uL (ref 0.0–0.7)
Neutrophils Relative %: 71 % (ref 43–77)

## 2010-04-30 LAB — TSH: TSH: 2.645 u[IU]/mL (ref 0.350–4.500)

## 2010-04-30 LAB — CARDIAC PANEL(CRET KIN+CKTOT+MB+TROPI)
CK, MB: 1 ng/mL (ref 0.3–4.0)
Relative Index: INVALID (ref 0.0–2.5)
Total CK: 38 U/L (ref 7–177)
Total CK: 32 U/L (ref 7–177)

## 2010-04-30 LAB — CBC
HCT: 42.3 % (ref 36.0–46.0)
Hemoglobin: 14.4 g/dL (ref 12.0–15.0)
MCH: 29.4 pg (ref 26.0–34.0)
MCHC: 34 g/dL (ref 30.0–36.0)
MCV: 86.5 fL (ref 78.0–100.0)
WBC: 8.3 10*3/uL (ref 4.0–10.5)

## 2010-04-30 LAB — BRAIN NATRIURETIC PEPTIDE: Pro B Natriuretic peptide (BNP): 187 pg/mL — ABNORMAL HIGH (ref 0.0–100.0)

## 2010-04-30 NOTE — Telephone Encounter (Signed)
Pts son had called earlier and wanted to know a list of medicines pt is taking.  I went over med list for her.  Dr Lovell Sheehan said pt was probably hypotensive.  Made Dr Cato Mulligan aware that pt was going to the hospital.

## 2010-04-30 NOTE — Telephone Encounter (Signed)
Pt saw Dr Rodena Medin 03/31/10 and rcvd med list and it had Clonidine .2mg  add back on to list. Pt had been off of this med. Pt had not eaten this a.m. And pt took Aspirin 325mg , Diltiazem 180mg  , HCTZ 25mg  and 1 Clonidine .2mg . Pt had been moving around earlier and then around lunch time, pt was lethargic and non responsive. Pts son picked her up and placed her at table. Pts hands went limp.

## 2010-04-30 NOTE — H&P (Signed)
NAME:  [name], [name]             ACCOUNT NO.:  [id number]  MEDICAL RECORD NO.:  [id number]           PATIENT TYPE:  E  LOCATION:  [facility]                         FACILITY:  [facility]  PHYSICIAN:  Zannie Cove, MD     DATE OF BIRTH:  07/03/1923  DATE OF ADMISSION:  04/30/2010 DATE OF DISCHARGE:                             HISTORY & PHYSICAL   PRIMARY CARE PHYSICIAN:  Valetta Mole. Swords, MD.  CHIEF COMPLAINT:  Passing out.  HISTORY OF PRESENT ILLNESS:  [name] is an 77 year old female with mild/early dementia presents to the hospital after a syncopal event at home.  Most of the history is provided through the patient's family members namely son and husband.  They report that she complained of being a little dizzy since Tuesday and this afternoon while at lunch table suddenly passed out, became limp and lost consciousness, had to be helped to the ground.  Denied any seizure activity.  Denied any incontinence of stool or urine.  Denied any fevers or chills.  No history of diarrhea, dysuria, chest pain, shortness of breath, palpitations, etc.   On evaluation in the ER as well as per EMS, she was found to be bradycardic in the 30s.  Upon further discussion with the son, he reports that the patient has been taking diltiazem CD 180 mg twice a day per instructions on the medication bottle, although it was prescribed as once a day by Dr. Cato Mulligan.  So he thinks that she could have been taking it twice a day.  In addition, she has also been taking Bystolic as well as clonidine twice a day, which could have all affected her heart rate. Rest of her vital signs are [name] and unremarkable including laboratory evaluation.  PAST MEDICAL HISTORY:  Significant for, 1. Early dementia. 2. History of hypertension. 3. History of degenerative joint disease. 4. History of angioedema in the past.  PAST SURGICAL HISTORY:  Significant for hysterectomy and appendectomy.  SOCIAL HISTORY:  Lives at home  with her husband.  Has a son, [name] [name], who lives in [place].  His telephone number is [phone].  Denies any history of smoking or illicit drug use.  Drinks a glass of wine a week and is pretty active, plays golf occasionally, and independent of most of her ADLs.  REVIEW OF SYSTEMS:  A 12-system review is negative except per HPI.  MEDICATIONS:  Include, 1. Aspirin 325 mg daily, however, list is yet to be confirmed. 2. Bystolic 20 mg daily. 3. Cardizem CD 180 mg once a day, reportedly been taking it twice a     day. 4. Clonidine 0.2 mg p.o. b.i.d. 5. Hydrochlorothiazide 12.5 mg p.o. daily. 6. Lomotil p.r.n.  ALLERGIES:  INCLUDE PENICILLIN AND PRINIVIL.  PHYSICAL EXAMINATION:  VITAL SIGNS:  Temperature is 97.9, pulse is 38, blood pressure 139/39, respirations 16, satting 100% on 2 liters. GENERAL:  She is averagely-built Caucasian female lying in the stretcher, in no acute distress. HEENT:  Pupils round and reactive to light.  Extraocular movements intact.  Oral mucosa is dry. NECK:  No JVD, lymphadenopathy, or thyromegaly. CARDIOVASCULAR:  S1 and S2, regular rate and  rhythm. LUNGS:  Clear to auscultation bilaterally. ABDOMEN:  Soft, scaphoid, nontender.  Positive bowel sounds.  No organomegaly. EXTREMITIES:  No edema, clubbing, or cyanosis. NEURO:  She moves all extremities.  Plantars are downgoing.  Reflexes are 1+ upper extremity, 2+ lower extremities.  Gait is not assessed.  LABORATORY DATA:  Review of laboratory data shows CBC; white count of 8.3 with no left shift, hemoglobin 15, platelets are 180.  Chemistry; sodium 140, potassium 4.2, chloride 101, bicarb 31, BUN 23, creatinine 1.4.  Last creatinine was 1.0 in March of this year.  LFTs unremarkable. EKG shows sinus bradycardia, no AV block noted, and no acute ST-T wave changes.  ASSESSMENT/PLAN:  [name] is an 77 year old female with, 1. Syncope. 2. Bradycardia, suspect medication induced. 3. Hypertension. 4.  Early dementia. 5. Acute kidney injury, mild.  PLAN:  1. Admit her to telemetry overnight.  Hold Cardizem, Bystolic, and Cardizem CD now and reintroduce slowly as heart rate tolerates.  We will also check cardiac markers, 2-D echo, and TSH.  2.For her early dementia,we will check TSH, RPR, B12.   3.For acute kidney injury, hold her hydrochlorothiazide but on gentle saline at 75 an hour.  Check a BMET in the morning.  We also get PT/OT involved and hopefully the patient can be discharged home in a day or two.  Further management as condition evolves.     Zannie Cove, MD     PJ/MEDQ  D:  04/30/2010  T:  04/30/2010  Job:  [id number]  cc:   Valetta Mole. Swords, MD  Electronically Signed by Zannie Cove  on 04/30/2010 05:35:00 PM

## 2010-05-01 DIAGNOSIS — R55 Syncope and collapse: Secondary | ICD-10-CM

## 2010-05-01 LAB — BASIC METABOLIC PANEL
BUN: 19 mg/dL (ref 6–23)
GFR calc Af Amer: 56 mL/min — ABNORMAL LOW (ref >60)
Potassium: 3.6 mEq/L (ref 3.5–5.1)

## 2010-05-01 LAB — VITAMIN B12: Vitamin B-12: 356 pg/mL (ref 211–911)

## 2010-05-01 LAB — CBC
HCT: 42.2 % (ref 36.0–46.0)
MCH: 29.1 pg (ref 26.0–34.0)
MCV: 87.2 fL (ref 78.0–100.0)
WBC: 9.9 10*3/uL (ref 4.0–10.5)

## 2010-05-01 LAB — RPR: RPR Ser Ql: NONREACTIVE

## 2010-05-01 LAB — CARDIAC PANEL(CRET KIN+CKTOT+MB+TROPI)
Relative Index: INVALID (ref 0.0–2.5)
Troponin I: 0.02 ng/mL (ref 0.00–0.06)

## 2010-05-01 LAB — LIPID PANEL
Total CHOL/HDL Ratio: 3.4 RATIO
VLDL: 22 mg/dL (ref 0–40)

## 2010-05-05 ENCOUNTER — Ambulatory Visit (INDEPENDENT_AMBULATORY_CARE_PROVIDER_SITE_OTHER)
Admission: RE | Admit: 2010-05-05 | Discharge: 2010-05-05 | Disposition: A | Discharge status: Home / Self Care | Payer: MEDICARE | Source: Ambulatory Visit | Attending: Cardiovascular Disease | Admitting: Cardiovascular Disease

## 2010-05-05 ENCOUNTER — Encounter (INDEPENDENT_AMBULATORY_CARE_PROVIDER_SITE_OTHER): Payer: MEDICARE

## 2010-05-05 ENCOUNTER — Other Ambulatory Visit: Payer: Self-pay | Admitting: Internal Medicine

## 2010-05-05 ENCOUNTER — Ambulatory Visit (INDEPENDENT_AMBULATORY_CARE_PROVIDER_SITE_OTHER): Payer: Medicare Other | Admitting: Internal Medicine

## 2010-05-05 ENCOUNTER — Encounter: Payer: Self-pay | Admitting: Internal Medicine

## 2010-05-05 VITALS — BP 182/90 | HR 68 | Temp 98.4°F | Wt 136.0 lb

## 2010-05-05 DIAGNOSIS — I6350 Cerebral infarction due to unspecified occlusion or stenosis of unspecified cerebral artery: Secondary | ICD-10-CM

## 2010-05-05 DIAGNOSIS — I160 Hypertensive urgency: Secondary | ICD-10-CM

## 2010-05-05 DIAGNOSIS — I10 Essential (primary) hypertension: Secondary | ICD-10-CM

## 2010-05-05 DIAGNOSIS — R55 Syncope and collapse: Secondary | ICD-10-CM

## 2010-05-05 DIAGNOSIS — R42 Dizziness and giddiness: Secondary | ICD-10-CM

## 2010-05-05 MED ORDER — IRBESARTAN 150 MG PO TABS: 150.0000 mg | ORAL_TABLET | Freq: Every day | ORAL | Status: DC

## 2010-05-05 NOTE — Assessment & Plan Note (Addendum)
Uncontrolled hypertension. Recent syncope. Now with new onset vertigo. Given her history of a stroke I'm concerned. I think at a minimum she needs a CT of the head without contrast to rule out a bleed. If that's negative I think more aggressive treatment of her blood pressures indicated. A long discussion with the patient and her son regarding antihypertensive treatment. The side effects of antihypertensive treatment were discussed. She is in a difficult situation. I do think she is taking her medication as prescribed now. In the past she was very noncompliant with medications and took extra doses of medications. She does have a history of angioedema. She was taking an ACE inhibitor at the time of initial angioedema presentation. She has had angioedema while not on an ACE inhibitor. I think it's reasonably safe to start her on an angiotensin receptor blocker. This was discussed with the patient and her son at length. The risks of taking an angiotensin receptor blocker with a history of allergy to an ACE inhibitor were discussed. In the hospital record there is notation that the patient has had angioedema in response to an angiotensin receptor blocker. This was reviewed with the patient. The patient has no recollection of this. I'm unable to prove this in her previous records.  I'll follow up with her shortly. CT of the head was scheduled urgently.

## 2010-05-05 NOTE — Assessment & Plan Note (Signed)
Patient with a history of stroke. Now she is new onset neurologic symptoms consisting of profound vertigo. Needs further evaluation. CT of the head without contrast will be scheduled.

## 2010-05-05 NOTE — Progress Notes (Signed)
  Subjective:    Patient ID: [name] [name], female    DOB: [birth year], 77 y.o.   MRN: [id number]  HPI  Patient comes in for followup visit. She was recently hospitalized. I have reviewed the discharge summary. The patient was admitted with syncope. It was thought related to medications. Patient was taking Cardizem, diastolic and clonidine. Her heart rate was noted to be bradycardic. It was thought that part of her syncope was likely due to nodal blockade. She also had significant hypertension in the hospital. She has struggled with hypertension before and has had significant difficulty with hypertension including stroke. The patient has been monitoring her blood pressure at home. Range has been 160/190/80s.  Patient is currently feeling better except she has developed a "woozy sensation". She describes a vertigo sensation that is worsened when she moves. No recurrent syncope. Patient denies any chest pain, neurologic deficits. She does admit to significant fatigue. Her medications are reviewed.  Past Medical History  Diagnosis Date  . Hypertension   . Osteopenia   . CVD (cardiovascular disease)   . Angioedema   . Arthritis   . Stroke    Past Surgical History  Procedure Date  . Abdominal hysterectomy 1963    unilateral oophorectomy  . Bunionectomy 1986  . Dilation and curettage of uterus 1962    reports that she has quit smoking. She does not have any smokeless tobacco history on file. She reports that she drinks alcohol. Her drug history not on file. family history includes Heart disease in her father and mother.  There is no history of Colon cancer. Allergies  Allergen Reactions  . Ace Inhibitors     REACTION: angioedema  . Famotidine     REACTION: tongue swelling  . Felodipine     REACTION: ? angioedma after taking for 26 days---UNCLEAR  . Fexofenadine     REACTION: knees buckled  . Lisinopril     REACTION: itching, rash  . Metoprolol Tartrate     REACTION: vertigo  .  Penicillins     REACTION: rash  . Pneumococcal Vaccine Polyvalent     REACTION: unspecified     Review of Systems    patient denies chest pain, shortness of breath, orthopnea. Denies lower extremity edema, abdominal pain, change in appetite, change in bowel movements. Patient denies rashes, musculoskeletal complaints. No other specific complaints in a complete review of systems.    Objective:   Physical Exam Elderly female in no acute distress. HEENT exam atraumatic, normocephalic, neck is supple. Chest is clear to auscultation without increased work of breathing. Neck is without jugular venous distention. Cardiac exam S1 and S2 are regular. She does have a soft S4 and a 2/6 HSM. Abdominal exam abdomen sounds, soft. Extremities without clubbing cyanosis or edema. Neurologic exam she is alert and has a moderately broad-based gait. No nystagmus       Assessment & Plan:

## 2010-05-07 ENCOUNTER — Telehealth: Payer: Self-pay | Admitting: *Deleted

## 2010-05-07 LAB — DIFFERENTIAL
Basophils Absolute: 0.1 10*3/uL (ref 0.0–0.1)
Basophils Relative: 1 % (ref 0–1)
Lymphocytes Relative: 15 % (ref 12–46)
Neutro Abs: 7.7 10*3/uL (ref 1.7–7.7)

## 2010-05-07 LAB — ABO/RH: ABO/RH(D): O POS

## 2010-05-07 LAB — POCT I-STAT, CHEM 8
BUN: 15 mg/dL (ref 6–23)
Calcium, Ion: 1.01 mmol/L — ABNORMAL LOW (ref 1.12–1.32)
Glucose, Bld: 93 mg/dL (ref 70–99)
HCT: 49 % — ABNORMAL HIGH (ref 36.0–46.0)
TCO2: 27 mmol/L (ref 0–100)

## 2010-05-07 LAB — TYPE AND SCREEN: ABO/RH(D): O POS

## 2010-05-07 LAB — CBC
Platelets: 193 10*3/uL (ref 150–400)
RDW: 15 % (ref 11.5–15.5)

## 2010-05-07 NOTE — Telephone Encounter (Signed)
Monitor returned today reviewed  by Dr Doree Fudge- wide QRS  tachycardia, A flutter + a fib with aberrancy vs VT vs SVT. Dr Ladona Ridgel talked with son, Annette Stable and recommended hospital  admission today. Bill declined to take pt to hospital today for admission. Bill did agree to take pt to [facility] for direct admission 05/08/10. Luc[facility] Crater notiifed pt direct adx for 05/08/10, Joni Reining called at [facility] admitting, monitor results faxed to [phone]. Angelique Blonder will notify [employer] of admission.

## 2010-05-08 ENCOUNTER — Inpatient Hospital Stay (HOSPITAL_COMMUNITY)
Admission: AD | Admit: 2010-05-08 | Discharge: 2010-05-10 | DRG: 310 | Disposition: A | Discharge status: Home / Self Care | Payer: MEDICARE | Source: Ambulatory Visit | Attending: Cardiology | Admitting: Cardiology

## 2010-05-08 DIAGNOSIS — F0390 Unspecified dementia without behavioral disturbance: Secondary | ICD-10-CM | POA: Diagnosis present

## 2010-05-08 DIAGNOSIS — I517 Cardiomegaly: Secondary | ICD-10-CM

## 2010-05-08 DIAGNOSIS — I10 Essential (primary) hypertension: Secondary | ICD-10-CM | POA: Diagnosis present

## 2010-05-08 DIAGNOSIS — E876 Hypokalemia: Secondary | ICD-10-CM | POA: Diagnosis present

## 2010-05-08 DIAGNOSIS — R000 Tachycardia, unspecified: Secondary | ICD-10-CM

## 2010-05-08 DIAGNOSIS — I498 Other specified cardiac arrhythmias: Principal | ICD-10-CM | POA: Diagnosis present

## 2010-05-08 DIAGNOSIS — E785 Hyperlipidemia, unspecified: Secondary | ICD-10-CM | POA: Diagnosis present

## 2010-05-08 DIAGNOSIS — R4182 Altered mental status, unspecified: Secondary | ICD-10-CM | POA: Diagnosis present

## 2010-05-08 DIAGNOSIS — Z8673 Personal history of transient ischemic attack (TIA), and cerebral infarction without residual deficits: Secondary | ICD-10-CM

## 2010-05-08 DIAGNOSIS — R55 Syncope and collapse: Secondary | ICD-10-CM | POA: Diagnosis present

## 2010-05-08 LAB — CBC
MCHC: 34.2 g/dL (ref 30.0–36.0)
MCV: 86.4 fL (ref 78.0–100.0)
Platelets: 194 10*3/uL (ref 150–400)
RDW: 13.9 % (ref 11.5–15.5)
WBC: 10.4 10*3/uL (ref 4.0–10.5)

## 2010-05-08 LAB — COMPREHENSIVE METABOLIC PANEL
Albumin: 3.8 g/dL (ref 3.5–5.2)
Alkaline Phosphatase: 56 U/L (ref 39–117)
BUN: 17 mg/dL (ref 6–23)
Calcium: 9.3 mg/dL (ref 8.4–10.5)
Glucose, Bld: 96 mg/dL (ref 70–99)
Potassium: 3.5 mEq/L (ref 3.5–5.1)
Total Protein: 6.5 g/dL (ref 6.0–8.3)

## 2010-05-08 LAB — MAGNESIUM: Magnesium: 2.4 mg/dL (ref 1.5–2.5)

## 2010-05-08 NOTE — Consult Note (Addendum)
NAME:  [name] [name], [name] [name]             ACCOUNT NO.:  [id number]  MEDICAL RECORD NO.:  [id number]           PATIENT TYPE:  I  LOCATION:  3739                         FACILITY:  [facility]  PHYSICIAN:  Bevelyn Buckles. Bensimhon, MDDATE OF BIRTH:  [birth year]  DATE OF CONSULTATION: DATE OF DISCHARGE:                                CONSULTATION   PRIMARY CARDIOLOGIST:  Marca Ancona, MD  PRIMARY MEDICAL DOCTOR:  Valetta Mole. Swords, MD  CHIEF COMPLAINT:  My monitor was abnormal.  REASON FOR ADMISSION:  Wide-complex tachycardia.  HISTORY OF PRESENT ILLNESS:  [name] [name] is an 77 year old female with a history of hypertension and mild dementia, CVA, who was recently admitted in late March for an episode of syncope. However, she had documented a documented wide-complex tachycardia on outpatient event monitoring prompting today's admission.  Her son relays the events of her prior admission. On April 24, 2010, her son states that she had been taking diltiazem twice a day as well as clonidine. He came home to find her sitting in a chair with her head down, unresponsive.  He was able to move her into the kitchen after she became responsive; however, she went out again.  Her hands went limp by her side she was not able to speak.  Heart rates were apparently down into the 30s and 40s during that admission and  her AV nodal blocking agents were discontinued.    Since discharge, she has had no further syncope. She had an event monitor placed as an outpatient, which revealed normal sinus rhythm with wide-complex tachycardia.  Nursing reports from our office states that Dr. Ladona Ridgel reviewed, the strip in flutter is  likely atrial flutter with 1:1 conduction, versus less likely VT or SVT.  Dr. Ladona Ridgel initially recommended hospital admission yesterday, but the patient's son declined and did agree to admitting her today.  She is currently asymptomatic.  She denies any chest pain or shortness of breath. She does, however,  think she is able to tell when this tachycardia comes on because there are times when she feels "funny,"  nauseated, and lightheaded and has to lie down.  She feels that they come on if she gets anxious or nervous. The first episode occurred only shortly after the monitor was placed in the first place.  Lab work from last admission was mostly unremarkable,  including normal TSH and CBC and BMET with a creatinine of 1.12.  PAST MEDICAL HISTORY: 1. Syncope, admitted just last week in the setting of bradycardia with heart rates in the 30s to 40s with AV nodal blocking agents discontinued at that time. 2. Hypertension. 3. Mild dementia. 4. CVA. 5. Degenerative joint disease. 6. Carotid stenosis per prior consultation but no documentation of such is available. 7. Last echo in 2007 showing an EF of 65% with mild AS and mild MR. 8. Status post appendectomy. 9. Status post hysterectomy.  MEDICATIONS: 1. Norvasc 10 mg daily. 2. HCTZ 25 mg daily. 3. Aspirin 81 mg daily. 4. Multivitamin 1 tablet daily. 5. Vitamin C 500 mg daily. 6. Avapro 150 mg daily.  Please note Dr. Cato Mulligan is aware that she has  a history of angioedema with ACE inhibitors and per his note in     Epic had an in depth discussion about an ARB. The patient has had     no evidence of allergic reaction since being started on it.  ALLERGIES:  PCN. ACE INHIBITORS CAUSE ANGIOEDEMA.  SOCIAL HISTORY:  [name] [name] lives with her husband.  She has two sons, one of whom Biomedical scientist) is present during the interview with her consent.  Her husband does most of her grocery shopping, and she helps with her chores.  She usually plays golf, but hasn't done it much this past year.  She quit smoking 50 years ago and drinks one to two glass of wine per week.  FAMILY HISTORY:  She is unsure of her mother's history.  Her father had CAD and an MI.  REVIEW OF SYSTEMS:  No fevers, chills, nausea, vomiting, bright blood per rectum, melena, or  hematemesis.  See HPI for pertinent positives. All other system review is otherwise negative.  LABORATORY DATA:  The patient has not had any lab since admission yet. TSH was 2.645 on April 27, 2010.  On May 01, 2010, her BMET was okay with a creatinine of 1.12.  CBC was also normal.  RPR was negative. Total cholesterol 160, triglycerides 1082, HDL 49, LDL 97.  EKG pending from this admission.  RADIOLOGY:  None yet.  PHYSICAL EXAM:  VITAL SIGNS:  Temperature 98.5, pulse 78, respirations 18, blood pressure 151/70, pulse oximetry 97% on room air. GENERAL:  This is a very pleasant white female, in no acute distress. HEENT:  Normocephalic and atraumatic with extraocular movements intact and clear sclerae.  Nares without discharge. NECK:  Supple with soft carotid bruits. HEART:  Auscultation of the heart reveals regular rate and rhythm with a pronounced PMI, palpable with a short 2/6 ejection murmur at the left upper sternal border with occasional ectopy. LUNGS:  Auscultation of the lungs reveals fair breath sounds bilaterally without wheezes, rales, or rhonchi. ABDOMEN:  Soft, nontender, and nondistended.  Positive bowel sounds. EXTREMITIES:  Warm, dry, and without edema.  She has 2+ pedal pulses bilaterally. NEUROLOGIC:  She is alert and oriented x3, responds to the questions appropriately with normal affect.  ASSESSMENT/PLAN:  The patient was seen and examined by Dr. Gala Romney and myself.  This is an 77 year old female with a history of recent syncope in the setting of documented bradycardia with AV nodal agent discontinued, hypertension, mild dementia, and prior CVA as well as normal EF in 2007, who presents with symptomatic tachycardia.  She reports episodes of lightheadedness, nausea, and feeling funny with documented wide-complex tachycardia on event monitor.  Please see strips.  At this time, we suspect this is an atrial rhythm with aberrancy; however, we will ask EP to weigh  in on their determination of this rhythm.  Given her history of bradycardia, she is not an ablation candidate.  She may need an antiarrhythmic +/- device. We will check a 2-D echocardiogram as her last assessment was back in 2007.  Otherwise, she will be continued on her home medications.     Dayna Dunn, P.A.C.   ______________________________ Bevelyn Buckles. Bensimhon, MD    DD/MEDQ  D:  05/08/2010  T:  05/08/2010  Job:  [id number]  cc:   Marca Ancona, MD Valetta Mole. Swords, MD  Electronically Signed by Ronie Spies  on 05/08/2010 03:35:59 PM Electronically Signed by Arvilla Meres MD on 06/02/2010 07:14:26 PM

## 2010-05-09 DIAGNOSIS — I4891 Unspecified atrial fibrillation: Secondary | ICD-10-CM

## 2010-05-09 LAB — CBC
HCT: 42.3 % (ref 36.0–46.0)
Hemoglobin: 14 g/dL (ref 12.0–15.0)
MCV: 87.2 fL (ref 78.0–100.0)
RBC: 4.85 MIL/uL (ref 3.87–5.11)
RDW: 14.2 % (ref 11.5–15.5)
WBC: 9 10*3/uL (ref 4.0–10.5)

## 2010-05-09 LAB — BASIC METABOLIC PANEL
BUN: 18 mg/dL (ref 6–23)
CO2: 28 mEq/L (ref 19–32)
Chloride: 100 mEq/L (ref 96–112)
GFR calc non Af Amer: 48 mL/min — ABNORMAL LOW (ref >60)
Glucose, Bld: 151 mg/dL — ABNORMAL HIGH (ref 70–99)
Potassium: 3.4 mEq/L — ABNORMAL LOW (ref 3.5–5.1)
Sodium: 137 mEq/L (ref 135–145)

## 2010-05-10 LAB — DIFFERENTIAL
Basophils Absolute: 0 10*3/uL (ref 0.0–0.1)
Basophils Relative: 0 % (ref 0–1)
Eosinophils Absolute: 0.1 10*3/uL (ref 0.0–0.7)
Eosinophils Relative: 1 % (ref 0–5)
Lymphocytes Relative: 11 % — ABNORMAL LOW (ref 12–46)
Lymphs Abs: 1.2 K/uL (ref 0.7–4.0)
Monocytes Absolute: 1.1 10*3/uL — ABNORMAL HIGH (ref 0.1–1.0)
Monocytes Relative: 10 % (ref 3–12)
Neutro Abs: 9.1 K/uL — ABNORMAL HIGH (ref 1.7–7.7)
Neutrophils Relative %: 79 % — ABNORMAL HIGH (ref 43–77)

## 2010-05-10 LAB — CBC
HCT: 48.4 % — ABNORMAL HIGH (ref 36.0–46.0)
Hemoglobin: 16.3 g/dL — ABNORMAL HIGH (ref 12.0–15.0)
MCHC: 33.6 g/dL (ref 30.0–36.0)
MCV: 87.2 fL (ref 78.0–100.0)
Platelets: 194 K/uL (ref 150–400)
RBC: 5.55 MIL/uL — ABNORMAL HIGH (ref 3.87–5.11)
RDW: 14.2 % (ref 11.5–15.5)
WBC: 11.5 K/uL — ABNORMAL HIGH (ref 4.0–10.5)

## 2010-05-10 LAB — BASIC METABOLIC PANEL WITH GFR
BUN: 12 mg/dL (ref 6–23)
GFR calc non Af Amer: >60 mL/min (ref >60)
Potassium: 3.5 meq/L (ref 3.5–5.1)
Sodium: 141 meq/L (ref 135–145)

## 2010-05-10 LAB — BASIC METABOLIC PANEL
BUN: 15 mg/dL (ref 6–23)
CO2: 28 mEq/L (ref 19–32)
CO2: 30 mEq/L (ref 19–32)
Calcium: 9.1 mg/dL (ref 8.4–10.5)
Chloride: 100 mEq/L (ref 96–112)
Chloride: 102 mEq/L (ref 96–112)
Creatinine, Ser: 0.91 mg/dL (ref 0.4–1.2)
Creatinine, Ser: 0.86 mg/dL (ref 0.4–1.2)
GFR calc Af Amer: >60 mL/min (ref >60)
Glucose, Bld: 99 mg/dL (ref 70–99)
Potassium: 3.8 mEq/L (ref 3.5–5.1)

## 2010-05-11 NOTE — Discharge Summary (Signed)
NAME:  [name] [name], [name] [name]             ACCOUNT NO.:  [id number]  MEDICAL RECORD NO.:  [id number]           PATIENT TYPE:  I  LOCATION:  6733                         FACILITY:  [facility]  PHYSICIAN:  Jeoffrey Massed, MD    DATE OF BIRTH:  03-30-1923  DATE OF ADMISSION:  04/30/2010 DATE OF DISCHARGE:  05/01/2010                        DISCHARGE SUMMARY - REFERRING   PRIMARY CARE PRACTITIONER:  Valetta Mole. Swords, MD  PRIMARY DISCHARGE DIAGNOSES: 1. Syncope. 2. Transient bradycardia, probably secondary to nodal blockers. 3. Uncontrolled hypertension.  SECONDARY DISCHARGE DIAGNOSES: 1. History of longstanding hypertension. 2. Mild dementia. 3. History of cerebrovascular accident. 4. History of angioedema in the past. 5. History of degenerative joint disease.  DISCHARGE MEDICATIONS: 1. Amlodipine 10 mg 1 tablet p.o. daily. 2. Hydrochlorothiazide 25 mg 1 tablet daily. 3. Aspirin 81 mg 1 tablet p.o. daily. 4. Multivitamins 1 tablet p.o. daily. 5. Vitamin C 500 mg 1 tablet p.o. daily.  CONSULTATIONS:  Dr. Shirlee Latch from [facility].  HISTORY OF PRESENT ILLNESS:  The patient is a very pleasant 77 year old female with the above-noted medical issues was brought to the hospital for a syncopal episode.  Per the patient's son, apparently the patient was also bradycardic in the 30s per EMS.  Apparently, this patient has been taking long-acting diltiazem twice a day instead of once a day and also has been on Bystolic along with clonidine.  Some of medications were just recently adjusted by her primary care practitioner.  In any event, she was admitted to the hospital.  She was obviously bradycardiac in the ED with the rates being on the 30s and 40s and hence admitted to the Hospitalist Service for further evaluation and treatment.  For further details, please see the history and physical that was dictated by Dr. Jomarie Longs on admission.  PERTINENT LABORATORY DATA: 1. Cardiac enzymes were  cycled and these were negative x3. 2. LDL cholesterol is 97. 3. RPR nonreactive. 4. TSH was 2.645.  RADIOLOGICAL STUDIES: 1. X-ray of the chest showed cardiomegaly without edema. 2. A 2-D echocardiogram was ordered and still pending.  HOSPITAL COURSE: 1. Syncope.  This was probably related to her nodal blockers namely     Cardizem and Bystolic, apparently she was also on clonidine.  Per     family particularly the patient's son, the patient apparently was     taking her Cardizem twice daily instead of daily.  Her heart rate     currently is in the 50s to 60s and has no further symptoms     including lightheadedness or dizziness.  At this point, it is felt     that her syncope was most likely to be directly secondary to her     meds, but per Cardiology it also identifies probable underlying     sinus node disease as well that potentially good progress with     time.  At this point, Cardiology is suggesting that this patient be     discharged.  They will arrange for an outpatient 3-week event     monitor. 2. Bradycardia.  This was again as noted above felt secondary to  medications, particularly nodal blockers.  Current plans are to     avoid nodal blockers.  Currently, the patient's heart rate resting     is on 50s to 60s.  All of her symptoms that she had of dizziness     and lightheadedness have also resolved.  As noted above, the     patient will have an event monitor per Cardiology note.  Per my     conversation with [facility], their office will call and     set this up. 3. Hypertension.  Unfortunately, the patient's blood pressure is still     elevated in the 160s to 170s range.  The patient's son is at     bedside and is very anxious to take her home.  He claims he has a     blood pressure monitoring machine at home and can check it and can     seek medical attention if it is extremely elevated.  At this point     in time, she is only being maintained on  hydrochlorothiazide 25 mg     and 5 mg of amlodipine.  I am going to up her amlodipine to 10 mg     and give an extra 5 mg before she goes home.  This patient has a     history of having angioedema secondary to numerous medications     including ACE inhibitor and angiotensin receptor blockers as well.     So at this point in time, our plan is to basically put on     hydrochlorothiazide and amlodipine and follow up blood pressure     trend and further optimization will needed to be done if needed by     her primary care practitioners along in consult with Cardiology.     Please note nodal blockers will need to be avoided as well. 4. History of CVA.  The patient is maintained on aspirin.  She     probably needs carotid Doppler, however, this can be done as an     outpatient. 5. Further workup including a lipid profile is also suggested and will     be done on an outpatient basis. 6. Please note that this patient and the patient's son is insisting on     being discharged today, the blood pressure is still high.  Did know     the risk of uncontrolled hypertension including target organ damage     particularly stroke.  All of this has been conveyed to them and     they are willing to accept the risk.  I have also indicated to the     patient's son that to keep an eye on her blood pressure at home and     if the readings are more than 180 systolic, to seek immediate     medical attention.  They claim understanding.  DISPOSITION:  The patient will be discharged home per the patient and family's request.  FOLLOWUP INSTRUCTIONS: 1. The patient will follow up with [facility] for a 3-week     event monitor. 2. The patient does need bilateral carotid Doppler. 3. She will also need lipid profile and these will be     deferred to her primary doctors. 4. The patient will need further optimization of her antihypertensive     regimen, however, nodal blockers,     angiotensin receptor  blockers, and ACE inhibitors will need to be     avoided given  a history of symptomatic bradycardia and angioedema.  Total time spent coordinating discharge includes 45 minutes.     Jeoffrey Massed, MD     SG/MEDQ  D:  05/01/2010  T:  05/01/2010  Job:  [id number]  cc:   Valetta Mole. Swords, MD Marca Ancona, MD  Electronically Signed by Jeoffrey Massed  on 05/11/2010 08:07:38 PM

## 2010-05-11 NOTE — Telephone Encounter (Signed)
Admission notification by Angelique Blonder 05/07/10 Richarda Osmond ref # [id number] 05/07/10

## 2010-05-15 ENCOUNTER — Ambulatory Visit: Payer: MEDICARE | Admitting: Internal Medicine

## 2010-05-18 NOTE — Consult Note (Signed)
NAME:  [name] [name], [name] [name]             ACCOUNT NO.:  [id number]  MEDICAL RECORD NO.:  [id number]          PATIENT TYPE:  INP  LOCATION:                               FACILITY:  [facility]  PHYSICIAN:  Marca Ancona, MD      DATE OF BIRTH:  February 01, 1924  DATE OF CONSULTATION:  05/01/2010 DATE OF DISCHARGE:                                CONSULTATION   HISTORY OF PRESENT ILLNESS:  This is an 77 year old with history of hypertension, prior stroke, and early dementia who is now in the hospital for syncope evaluation.  At baseline, the patient occasionally gets lightheaded with standing, however, she has not really had anything severe.  She has no history of exertional chest pain or exertional dyspnea.  She lives with her husband and has mild dementia but is able to do all her ADLs.  She had been on nebivolol 20 mg daily and diltiazem CD 180 mg b.i.d. as well as clonidine 0.2 mg b.i.d.  Apparently, she is only supposed to be taking diltiazem 180 mg once a day.  Yesterday, she had been out for a walk and came back into her house and was sitting in a chair, her son noticed that she was slumped over in the chair.  He went and was actually able to wake her up.  She felt somewhat groggy but stood up and walked to the kitchen.  She then sat down in the kitchen chair.  The family then noted that her had rolled back, and she was unresponsive a day later on the floor.  About 90 seconds later, she had awakened and was responsive.  The patient does not remember the event at all.  When EMS arrived, apparently her heart rate was in the 30s, so when she first got to the ER, her heart rate was in the 30s.  No further intervention was done.  Nebivolol, diltiazem, and clonidine were all held and her heart rate has now increased into the 50s.  She has been in the 50s on telemetry today.  She has been up walking around in the halls with no problems.  ALLERGIES:  The patient had angioedema with ACE  INHIBITOR.  PAST MEDICAL HISTORY: 1. Early dementia. 2. Hypertension. 3. Osteoarthritis. 4. History of angioedema with an ACE INHIBITOR. 5. Hysterectomy. 6. Appendectomy. 7. History of stroke with left hemiparesis. 8. Carotid stenosis.  MEDICATIONS AT HOME: 1. Bystolic 20 mg daily. 2. Diltiazem CD 180 mg b.i.d. 3. Aspirin 325 mg daily. 4. Clonidine 0.2 mg b.i.d. 5. Hydrochlorothiazide 12.5 mg daily.  CURRENT MEDICATIONS IN THE HOSPITAL: 1. Aspirin 325 mg daily. 2. Lovenox 40 mg daily. 3. Hydrochlorothiazide 25 mg daily.  SOCIAL HISTORY:  The patient lives in [place].  She is married.  She has one son.  She has never smoked.  She drinks about a glass of wine a week.  FAMILY HISTORY:  Noncontributory.  REVIEW OF SYSTEMS:  All systems were reviewed and were negative except as noted in the history of present illness.  PHYSICAL EXAMINATION:  VITAL SIGNS:  Temperature 98.1, blood pressure ranged from 159-187 over 60s to 90s, oxygen saturation 96%  on room air. Telemetry was reviewed, off nodal blockade and showed that her heart rate ranges from the high 40s to the high 50s at rest now. GENERAL:  This is an elderly woman, in no apparent distress. HEENT:  Normal exam. ABDOMEN:  Soft, nontender.  No hepatosplenomegaly.  Normal bowel sounds. EXTREMITIES:  No clubbing or cyanosis. NECK:  There is no JVD, no thyromegaly or thyroid nodule. CARDIOVASCULAR:  Heart regular, S1 and S2.  Soft S4.  There is 1/6 systolic ejection murmur at the right upper sternal border.  There is trace ankle edema.  There are trace PT pulses bilaterally.  There are bilateral carotid bruits. LUNGS:  Clear to auscultation bilaterally with normal respiratory effort. SKIN:  Normal exam. NEUROLOGIC:  Alert and oriented x3.  Normal affect.  RADIOLOGY:  Chest x-ray shows cardiomegaly with clear lung fields. EKG in the emergency department shows sinus bradycardia at a rate of 39.  LABORATORY DATA:  White  count 9.9, hematocrit 42.2, platelets 192. Potassium 3.6, creatinine 1.12.  BNP 187.  TSH normal.  Cardiac enzymes negative x3.  B12 level was normal.  IMPRESSION:  This is an 77 year old with a history of stroke and cerebrovascular accident, as well as mild dementia, who presented with syncopal episode. 1. Syncope.  The patient's heart rate was in the 30s when emergency     medical services arrived in the patient's house and also in the     emergency department.  She was on high doses of diltiazem CD and     nebivolol as well as clonidine.  Since stopping nodal blockade, the     patient's heart rate has been in the 50s with no further     lightheadedness.  She has been walking today with no problems.  I     suspect the syncope was most likely to be directly secondary to     nodal blocking medications but it also probably identifies     underlying sinus node disease that could progress over time.     However, there is no indication for pacemaker at this point.  We     will need to leave her off all nodal blockers.  She had an echo     today and we are awaiting the report of this, if this is     unremarkable, I think she can go home with a 3-week event monitor. 2. Hypertension.  The patient needs treatment for this.  She needs to     continue her hydrochlorothiazide.  We will add Norvasc 5 mg daily.     Avoid nodal blockade, also avoid angiotensin-converting enzyme     inhibitors, and ARBs with her history of angioedema. 3. History of stroke.  The patient has bilateral carotid bruits.  She     needs carotid Dopplers to be done as an outpatient, needs to     continue aspirin.  Given the history of stroke, she likely needs a     statin; however, this can be decided as an outpatient.     Marca Ancona, MD     DM/MEDQ  D:  05/01/2010  T:  05/02/2010  Job:  [id number]  Electronically Signed by Marca Ancona MD on 05/18/2010 09:02:15 AM

## 2010-05-20 ENCOUNTER — Ambulatory Visit (INDEPENDENT_AMBULATORY_CARE_PROVIDER_SITE_OTHER): Payer: MEDICARE | Admitting: Family Medicine

## 2010-05-20 ENCOUNTER — Encounter: Payer: Self-pay | Admitting: Family Medicine

## 2010-05-20 VITALS — BP 148/70 | Temp 98.9°F | Wt 137.0 lb

## 2010-05-20 DIAGNOSIS — I10 Essential (primary) hypertension: Secondary | ICD-10-CM

## 2010-05-20 NOTE — Progress Notes (Signed)
  Subjective:    Patient ID: [name] [name], female    DOB: 23-Aug-1923, 77 y.o.   MRN: [id number]  HPI Patient seen with chief complaint of feeling lightheaded and feeling "woosy".  She's had very difficult to control blood pressure for some time. Prior history of cerebrovascular disease. She's had a couple of recent hospitalization and apparently some orthostasis recently. Clonidine was discontinued. Recent addition of Avapro and increased to 300 mg during recent hospitalization. Also addition of flecainide 50 mg twice daily and potassium. She is compliant with these medications but did not take her Avapro this morning because of feeling lightheaded. No syncope. Denies chest pains or dyspnea. No peripheral edema issues. Other medications reviewed. She also takes amlodipine 10 mg daily, aspirin 81 mg daily, hydrochlorothiazide 25 mg one half tablet daily.  She states she is drinking fluids fairly well.  Recent CT of head no acute abnormality. Patient complains of no focal weakness   Review of Systems  Constitutional: Negative for fever, chills, activity change and unexpected weight change.  Eyes: Negative for visual disturbance.  Respiratory: Negative for cough and shortness of breath.   Cardiovascular: Negative for chest pain.  Gastrointestinal: Negative for abdominal pain and blood in stool.  Genitourinary: Negative for dysuria.  Skin: Negative for rash.  Neurological: Positive for dizziness, weakness and light-headedness. Negative for syncope, facial asymmetry and headaches.       Objective:   Physical Exam  Constitutional: She is oriented to person, place, and time. She appears well-developed and well-nourished.  Cardiovascular: Normal rate and regular rhythm.   Murmur heard. Pulmonary/Chest: Breath sounds normal. She has no wheezes. She has no rales. She exhibits no tenderness.  Musculoskeletal: She exhibits no edema.  Neurological: She is alert and oriented to person, place, and  time. No cranial nerve deficit.          Assessment & Plan:  #1 hypertension improved #2 dizziness. No significant orthostasis today. Does have 12 point drop in blood pressure with standing. We have not recommended any BP med changes at this time.  She is encouraged to keep follow up with cardiology and Dr Cato Mulligan.  Discussed measures to reduce risk of fall.  She is using cane or walker for ambulation.

## 2010-05-25 ENCOUNTER — Telehealth: Payer: Self-pay | Admitting: *Deleted

## 2010-05-25 DIAGNOSIS — I10 Essential (primary) hypertension: Secondary | ICD-10-CM

## 2010-05-25 MED ORDER — OLMESARTAN MEDOXOMIL 40 MG PO TABS: 40.0000 mg | ORAL_TABLET | Freq: Every day | ORAL | Status: DC

## 2010-05-25 NOTE — Telephone Encounter (Signed)
Pts son called and said pt does not want to the avapro anymore cause it makes her dizzy.  She was taking 300mg  1/2 qd.  Per Dr Cato Mulligan ok to d/c avapro and call in Benicar 40mg .  Benicar called into [facility] [address]

## 2010-05-27 ENCOUNTER — Telehealth: Payer: Self-pay | Admitting: Internal Medicine

## 2010-05-27 DIAGNOSIS — I10 Essential (primary) hypertension: Secondary | ICD-10-CM

## 2010-05-27 MED ORDER — AMLODIPINE BESYLATE 10 MG PO TABS: 10.0000 mg | ORAL_TABLET | Freq: Every day | ORAL | Status: DC

## 2010-05-27 NOTE — Telephone Encounter (Signed)
PT NEEDS NEW RX SENT TO [facility] [phone] AMLODIPINE 10 MG #30

## 2010-05-27 NOTE — Telephone Encounter (Signed)
rx sent in electronically 

## 2010-05-28 ENCOUNTER — Encounter (HOSPITAL_COMMUNITY): Payer: MEDICARE | Admitting: Radiology

## 2010-06-02 ENCOUNTER — Encounter: Payer: Medicare Other | Admitting: Cardiology

## 2010-06-03 ENCOUNTER — Ambulatory Visit (INDEPENDENT_AMBULATORY_CARE_PROVIDER_SITE_OTHER): Payer: MEDICARE | Admitting: Family Medicine

## 2010-06-03 ENCOUNTER — Encounter: Payer: Self-pay | Admitting: Family Medicine

## 2010-06-03 ENCOUNTER — Telehealth: Payer: Self-pay | Admitting: *Deleted

## 2010-06-03 VITALS — BP 174/80 | HR 100 | Temp 98.5°F | Resp 12

## 2010-06-03 DIAGNOSIS — I10 Essential (primary) hypertension: Secondary | ICD-10-CM

## 2010-06-03 DIAGNOSIS — R531 Weakness: Secondary | ICD-10-CM

## 2010-06-03 DIAGNOSIS — R5383 Other fatigue: Secondary | ICD-10-CM

## 2010-06-03 MED ORDER — LORAZEPAM 0.5 MG PO TABS: 0.5000 mg | ORAL_TABLET | Freq: Every day | ORAL | Status: DC

## 2010-06-03 MED ORDER — AMLODIPINE BESYLATE 10 MG PO TABS: 5.0000 mg | ORAL_TABLET | Freq: Two times a day (BID) | ORAL | Status: DC

## 2010-06-03 NOTE — Telephone Encounter (Signed)
Pt walks in stating she is nauseated, had diarrhea, and is going to have to go to the ER if someone cannot see her today.   She will see Dr. Clent Ridges this afternoon.

## 2010-06-03 NOTE — Progress Notes (Signed)
  Subjective:    Patient ID: [name] [name], female    DOB: [birth year], 77 y.o.   MRN: [id number]  HPI Here for vague complaints of generalized weakness. She dsecribes feeling lightheaded, weak, and of having loose BMs. This has been going on for about one month, since her last hospital stay. She sees Dr. Shirlee Latch, and he started her on Fleconide earlier this year. She says sometimes her heart beats too fast and sometimes too slow. No SOB or chest pain. She simply says she "feels bad" all the time. She saw Dr. Caryl Never 2 weeks ago, and no med changes were recommended at that time. She says she feels the worst in the mornings, then feel better later in the day. She denies any foot or hand swelling. She has a lot of trouble sleeping at night, usually tossing and turning most of the night. She has little appetite but is drinking fluids.    Review of Systems  Constitutional: Positive for fatigue. Negative for fever and unexpected weight change.  Respiratory: Negative.   Cardiovascular: Negative.   Gastrointestinal: Positive for diarrhea. Negative for nausea, vomiting and abdominal pain.  Genitourinary: Negative.   Neurological: Positive for weakness. Negative for numbness and headaches.       Objective:   Physical Exam  Constitutional: She is oriented to person, place, and time.       Alert but weak  Neck: No thyromegaly present.  Cardiovascular: Normal rate, regular rhythm and intact distal pulses.  Exam reveals no gallop.        She has a 2/6 SM in the aortic area   Pulmonary/Chest: Effort normal and breath sounds normal. No respiratory distress. She has no wheezes. She has no rales. She exhibits no tenderness.  Abdominal: Soft. Bowel sounds are normal. She exhibits no distension and no mass. There is no tenderness. There is no rebound and no guarding.  Musculoskeletal: She exhibits no edema.  Lymphadenopathy:    She has no cervical adenopathy.  Neurological: She is alert and oriented to  person, place, and time. No cranial nerve deficit. She exhibits normal muscle tone. Coordination normal.  Skin: Skin is warm and dry.          Assessment & Plan:  It is unclear what the etiology of her weakness could be. Will get a BMET, UA, and CBC today. These may be medication side effects, so we will stop the HCTZ. We will divide her dose of Amlodipine  to 5 mg bid.

## 2010-06-04 LAB — BASIC METABOLIC PANEL
BUN: 21 mg/dL (ref 6–23)
CO2: 28 mEq/L (ref 19–32)
Calcium: 9.3 mg/dL (ref 8.4–10.5)
Creatinine, Ser: 1 mg/dL (ref 0.4–1.2)
Glucose, Bld: 115 mg/dL — ABNORMAL HIGH (ref 70–99)

## 2010-06-04 LAB — CBC WITH DIFFERENTIAL/PLATELET
Basophils Absolute: 0.1 10*3/uL (ref 0.0–0.1)
Eosinophils Absolute: 0 10*3/uL (ref 0.0–0.7)
Lymphocytes Relative: 15.6 % (ref 12.0–46.0)
MCHC: 33.8 g/dL (ref 30.0–36.0)
Neutro Abs: 7.5 10*3/uL (ref 1.4–7.7)
Neutrophils Relative %: 75.4 % (ref 43.0–77.0)
Platelets: 207 10*3/uL (ref 150.0–400.0)
RDW: 14.1 % (ref 11.5–14.6)

## 2010-06-06 NOTE — Discharge Summary (Signed)
NAME:  [name] [name], [name] [name]             ACCOUNT NO.:  [id number]  MEDICAL RECORD NO.:  [id number]           PATIENT TYPE:  I  LOCATION:  3739                         FACILITY:  [facility]  PHYSICIAN:  Gerrit Friends. Dietrich Pates, MD, FACCDATE OF BIRTH:  02-Jul-1923  DATE OF ADMISSION:  05/08/2010 DATE OF DISCHARGE:  05/10/2010                              DISCHARGE SUMMARY   PRIMARY CARDIOLOGIST:  Marca Ancona, MD.  PRIMARY CARE PHYSICIAN:  Valetta Mole. Swords, MD  ELECTROPHYSIOLOGIST:  Duke Salvia, MD, [facility]  PROCEDURES PERFORMED DURING HOSPITALIZATION: 1. Echocardiogram dated May 08, 2010.     a.     Moderate concentric hypertrophy systolic function normal,      the estimated ejection fraction range 55-60%.  Wall motion was      normal with no regional wall motion abnormalities.  Did have some      mildly calcified aortic valve annulus and moderately calcified      mitral valve annulus.  The left atrium was mildly to moderately      dilated, the right ventricle cavity size was normal.  FINAL DISCHARGE DIAGNOSES: 1. Atrial tachycardia with arrhythmias. 2. Hypertension. 3. Hypokalemia. 4. Altered mental status with questionable syncopal episode. 5. History of cerebrovascular accident. 6. Dyslipidemia.  HOSPITAL COURSE:  This is an 77 year old Caucasian female with history of hypertension, mild dementia, CVA who was admitted in late March for an episode of syncope.  She also had documented wide complex tachycardia on an outpatient event monitor placed by Dr. Shirlee Latch.  The patient was readmitted secondary to complaints of dizziness and abnormal Holter monitor.  The patient's monitor strips were reviewed by Dr. Ladona Ridgel, revealing atrial flutter with 1:1 conduction versus less likely VT or SVT.  Hospital admission was instituted and Electrophysiology consultation was completed.  The patient was seen by Dr. Graciela Husbands on May 08, 2010, she was begun on flecainide 150 mg p.o. b.i.d. without recurrence  of SVT or ventricular arrhythmias.  She did continue to have mild PVCs and was found to be mildly hypokalemic and this was repleted. She did have an echocardiogram completed as stated above with no significant wall motion abnormalities.  The patient was seen and examined by Dr. Cowan Bing on day of discharge and was found to be stable.  Her heart rate was only moderately controlled on her current medication regimen and therefore Benicar was increased to 300 mg daily. She was also given instructions on a high potassium diet as she had required potassium repleting during hospitalization.  The only medication she was taking that would possibly have contributed was hydrochlorothiazide.  She will return home on 10 mEq of potassium daily along with a high potassium diet.  It was also been advised by Dr. Dietrich Pates that she will need to have a treadmill stress test in the office without imaging.  DISCHARGE LABORATORIES:  Sodium 137, potassium 3.8, chloride 100, CO2 28, glucose 138, BUN 15, creatinine 0.90.  Hemoglobin 14.0, hematocrit 42.3, white blood cells 9.0, platelets 187, magnesium 2.4.  VITAL SIGNS ON DISCHARGE:  Blood pressure 146/74, heart rate 63, respirations 18, temperature 98.2, O2 sat 98% on room air.  EKG on discharge revealing sinus rhythm with frequent PVCs, possible premature intra-aberrant conduction rate of 75 beats per minute.  DISCHARGE MEDICATIONS: 1. Avapro 300 mg one p.o. daily (new higher dose). 2. Flecainide 50 mg 1 tablet by mouth b.i.d. (new prescription     provided). 3. Potassium 10 mEq one by mouth daily (new prescription). 4. Amlodipine 10 mg daily. 5. Aspirin 81 mg daily. 6. Hydrochlorothiazide 25 mg one half tablet daily. 7. Lomotil p.r.n. diarrhea. 8. Multivitamin daily. 9. Vitamin C 500 mg by mouth daily.  ALLERGIES:  PENICILLIN and LISINOPRIL causing tongue swelling.  FOLLOWUP PLANS AND APPOINTMENT: 1. The patient will follow up with Dr. Shirlee Latch  in the office.  Our     office will call to make an appointment. 2. The patient will follow up with Dr. Sherryl Manges in the office for     continued evaluation from an Electrophysiology standpoint on     flecainide. 3. The patient will have an outpatient stress test non-imaging in the     office for further evaluation of heart rate and the patient's     response to exercise. 4. The patient has been advised on a high potassium diet. 5. The patient has been advised on new medication she should be taking     on discharge. 6. She has been advised to bring all medications to followup     appointment.  Time spent with the patient to include physician time 40 minutes.     Bettey Mare. Lyman Bishop, NP   ______________________________ Gerrit Friends. Dietrich Pates, MD, [facility]    KML/MEDQ  D:  05/10/2010  T:  05/11/2010  Job:  [id number]  cc:   Valetta Mole. Swords, MD  Electronically Signed by Joni Reining NP on 05/11/2010 11:08:42 AM Electronically Signed by Justice Bing MD [facility] on 06/06/2010 10:16:37 PM

## 2010-06-08 NOTE — Progress Notes (Signed)
Pt. Informed.

## 2010-06-09 ENCOUNTER — Ambulatory Visit: Payer: MEDICARE | Admitting: Internal Medicine

## 2010-06-11 ENCOUNTER — Encounter (HOSPITAL_COMMUNITY): Payer: MEDICARE | Admitting: Radiology

## 2010-06-12 ENCOUNTER — Ambulatory Visit (INDEPENDENT_AMBULATORY_CARE_PROVIDER_SITE_OTHER): Payer: MEDICARE | Admitting: Internal Medicine

## 2010-06-12 ENCOUNTER — Telehealth: Payer: Self-pay | Admitting: Internal Medicine

## 2010-06-12 ENCOUNTER — Encounter: Payer: Self-pay | Admitting: Internal Medicine

## 2010-06-12 VITALS — BP 180/80 | Temp 98.4°F | Wt 136.0 lb

## 2010-06-12 DIAGNOSIS — I10 Essential (primary) hypertension: Secondary | ICD-10-CM

## 2010-06-12 DIAGNOSIS — R110 Nausea: Secondary | ICD-10-CM

## 2010-06-12 MED ORDER — ONDANSETRON HCL 4 MG PO TABS: 4.0000 mg | ORAL_TABLET | Freq: Three times a day (TID) | ORAL | Status: DC | PRN

## 2010-06-12 NOTE — Telephone Encounter (Signed)
Pt stopped taking Benicar last night, because pt is getting sick to stomach. Pt is wanting to know what she needs to do. Pt wants to find out which med is making her sick to stomach. Pls call.

## 2010-06-12 NOTE — Assessment & Plan Note (Signed)
Suboptimal control. Asx. Resume benicar. Has f/u next with pmd.

## 2010-06-12 NOTE — Assessment & Plan Note (Signed)
Begin zofran sparingly prn nausea. Obtain amylase/lipase. Suspect possible medication effect. Hold potassium x several days to determine if related. Consider flecainide effect. Cardiology has graciously worked patient in for appt 5/15. Pt and son made aware of appt.

## 2010-06-12 NOTE — Telephone Encounter (Signed)
Spoke to pt and her son.  She complains of unrelenting nausea, and is convinced it is some of her meds that may be causing it.  Her son is in town today to try and help her.  She is saying she is so sick, she does not want to live at times.  She is not eating x several days, and only drinking Ensure.  Advised to bring all meds in with her for evaluation with Dr. Rodena Medin.  Explained the nausea can be a symptom of many things, and this needs to be sorted out, and not over the phone.   Son agrees. Spoke to Dr. Doris Cheadle he is in agreement also.

## 2010-06-12 NOTE — Progress Notes (Signed)
  Subjective:    Patient ID: [name] [name], female    DOB: 02-12-1923, 77 y.o.   MRN: [id number]  HPI Pt presents to clinic for evaluation of nausea. Accompanied by son who assists in hx with her permission. Was hospitalized in March due to arrhythmia noted on cardiac monitor. Discharge summary indicates monitor strips showing atrial flutter 1:1 conduction versus less likely VT or SVT. Was begun on flecainide with no further arrhythmias noted. Was discharged home with new prescriptions for flecainide 50mg  bid, potassium qd and increased dose of avapro 300mg  qd. Pt specifically notes nausea began after beginning new medications from hospital stay. Avapro has since been changed to benicar by PMD without improvement of nausea.  Complains of constant nausea without abdominal pain or emesis. Notes generalized weakness and intermittent dizziness since new medications as well. Has significantly decreased appetite and been drinking primarily ensure supplements. Weight stable over last 2 months. Stopped benicar herself the last two days. No alleviating or exacerbating factors.  No other complaints. Reviewed recent May 2012 labwork as well as last hospital discharge summary.  Total time of visit approximately 35 minutes of which >50% spent in counseling.  Reviewed PMH, medications and allergies  Review of Systems  Constitutional: Positive for fatigue. Negative for fever and chills.  Gastrointestinal: Positive for nausea. Negative for vomiting, abdominal pain and abdominal distention.       Objective:   Physical Exam  [nursing notereviewed. Constitutional: She appears well-developed. No distress.  HENT:  Head: Normocephalic and atraumatic.  Eyes: Conjunctivae are normal. No scleral icterus.  Cardiovascular: Normal rate and regular rhythm.  Exam reveals no gallop and no friction rub.   Murmur heard.  Systolic murmur is present with a grade of 3/6  Pulmonary/Chest: Effort normal and breath  sounds normal. No respiratory distress. She has no wheezes. She has no rales.  Abdominal: Soft. Bowel sounds are normal. She exhibits no distension and no mass. There is no hepatosplenomegaly. There is no tenderness. There is no rebound and no guarding.  Skin: Skin is warm and dry. She is not diaphoretic.          Assessment & Plan:

## 2010-06-13 ENCOUNTER — Encounter: Payer: Self-pay | Admitting: *Deleted

## 2010-06-13 LAB — LIPASE: Lipase: 20 U/L (ref 0–75)

## 2010-06-15 ENCOUNTER — Ambulatory Visit (INDEPENDENT_AMBULATORY_CARE_PROVIDER_SITE_OTHER): Payer: MEDICARE | Admitting: Physician Assistant

## 2010-06-15 ENCOUNTER — Telehealth: Payer: Self-pay | Admitting: Cardiology

## 2010-06-15 ENCOUNTER — Telehealth: Payer: Self-pay

## 2010-06-15 ENCOUNTER — Encounter: Payer: Self-pay | Admitting: Physician Assistant

## 2010-06-15 ENCOUNTER — Telehealth: Payer: Self-pay | Admitting: Physician Assistant

## 2010-06-15 VITALS — BP 168/72 | HR 90 | Ht 64.0 in | Wt 135.0 lb

## 2010-06-15 DIAGNOSIS — I6529 Occlusion and stenosis of unspecified carotid artery: Secondary | ICD-10-CM

## 2010-06-15 DIAGNOSIS — I471 Supraventricular tachycardia, unspecified: Secondary | ICD-10-CM

## 2010-06-15 DIAGNOSIS — I4719 Other supraventricular tachycardia: Secondary | ICD-10-CM

## 2010-06-15 DIAGNOSIS — I10 Essential (primary) hypertension: Secondary | ICD-10-CM

## 2010-06-15 NOTE — Patient Instructions (Signed)
Stop taking Flecainide. Call us in 2 weeks if your symptoms have not completely resolved.  Schedule follow up with Dr. Shirlee Latch in 6 weeks.  Return sooner if you have rapid palpitations or you pass out or come close to passing out.

## 2010-06-15 NOTE — Telephone Encounter (Signed)
Message copied by Kyung Rudd on Mon Jun 15, 2010 10:30 AM ------      Message from: Letitia Libra, Maisie Fus      Created: Mon Jun 15, 2010  8:29 AM       Amylase and lipase nl

## 2010-06-15 NOTE — Telephone Encounter (Signed)
Flecainide stopped today. Tereso Newcomer, PA-C

## 2010-06-15 NOTE — Telephone Encounter (Signed)
She is on a low dose of flecainide.  She probably needs to stop it. I see that she is seeing Scott today.

## 2010-06-15 NOTE — Progress Notes (Signed)
History of Present Illness: Primary Cardiologist:  Dr. Marca Ancona Primary Electrophysiologist:  Dr. Sherryl Manges  [name] [name] is a 77 y.o. female With a history of hypertension, hyperlipidemia, prior stroke and mild dementia who was initially seen by Dr. Shirlee Latch after being admitted to [facility] 3/30 with syncope.  At that time she was taking diltiazem, nevbilol and clonidine with heart rates in the 30s.  Her rates did improve to the 50s with discontinuation of all of these.  She was then placed on outpatient event monitor.  She was contacted due to evidence of wide complex tachycardia on 4/6 and advised to report to the emergency room.  Initially it was felt that this could be atrial flutter with 1:1 conduction versus ventricular tachycardia or supraventricular tachycardia. Echocardiogram demonstrated normal LV function with moderate LVH and reported trivial aortic stenosis.  She was seen by electrophysiology.  Dr. Graciela Husbands felt like this represented atrial tachycardia with aberrancy and recommended starting flecainide.  She had no recurrence of arrhythmias in the hospital.  She saw her PCP recently due to nausea.  Follow up was arranged for tomorrow with concerns that her nausea was related to flecainide as her symptoms started when these medications were initiated.  She called today with worse symptoms and was added on to my schedule for evaluation.  Ever since starting flecainide, she has noted dizziness, diplopia, nausea, tremor.  She had her K+ stopped and was given Zofran.  This has helped minimally.  She denies chest pain, dyspnea, orthopnea, PND or edema.  She denies any further syncope or palpitations.    Past Medical History  Diagnosis Date  . Hypertension   . Osteopenia   . Cerebrovascular disease   . Angioedema     2/2 ACE inhibitors  . Arthritis   . Stroke   . Mild memory disturbances not amounting to dementia   . Atrial tachycardia     a.  eval for syncope 3/12 with  event monitor that demonstrated WCT felt to likely be ATach with aberrancy and Fleacainide started in 4/12;   b.  echo 05/08/10: EF 55-60%, mod LVH, trivial AS (AVA 1.4, mean gradient 24), trivial AI, mod MAC with mean gradient 10 mmHg, mild to mod LAE and mild RVH    Current Outpatient Prescriptions  Medication Sig Dispense Refill  . amLODipine (NORVASC) 10 MG tablet Take 0.5 tablets (5 mg total) by mouth 2 (two) times daily.  1 tablet  0  . Ascorbic Acid (VITAMIN C) 500 MG tablet Take 500 mg by mouth daily.        Marland Kitchen aspirin 81 MG tablet Take 81 mg by mouth daily.        . diphenoxylate-atropine (LOMOTIL) 2.5-0.025 MG per tablet Take 1 tablet by mouth 4 (four) times daily as needed.        Marland Kitchen LORazepam (ATIVAN) 0.5 MG tablet Take 1 tablet (0.5 mg total) by mouth at bedtime.  30 tablet  2  . Multiple Vitamin (MULTIVITAMIN) tablet Take 1 tablet by mouth daily.        Marland Kitchen olmesartan (BENICAR) 40 MG tablet Take 1 tablet (40 mg total) by mouth daily.  30 tablet  11  . ondansetron (ZOFRAN) 4 MG tablet Take 1 tablet (4 mg total) by mouth every 8 (eight) hours as needed for nausea.  30 tablet  0  . DISCONTD: flecainide (TAMBOCOR) 50 MG tablet Take 50 mg by mouth 2 (two) times daily.        Marland Kitchen  DISCONTD: potassium phosphate, monobasic, (K-PHOS ORIGINAL) 500 MG tablet Take 500 mg by mouth daily.          Allergies: Allergies  Allergen Reactions  . Ace Inhibitors     REACTION: angioedema  . Famotidine     REACTION: tongue swelling  . Felodipine     REACTION: ? angioedma after taking for 26 days---UNCLEAR  . Fexofenadine     REACTION: knees buckled  . Flecainide Nausea Only  . Lisinopril     REACTION: itching, rash  . Metoprolol Tartrate     REACTION: vertigo  . Penicillins     REACTION: rash  . Pneumococcal Vaccine Polyvalent     REACTION: unspecified    Vital Signs: BP 168/72  Pulse 90  Ht 5\' 4"  (1.626 m)  Wt 135 lb (61.236 kg)  BMI 23.17 kg/m2  PHYSICAL EXAM: Well nourished, well  developed, in no acute distress HEENT: normal Neck: no JVD Cardiac:  normal S1, S2; RRR; 1/6 systolic murmur Lungs:  clear to auscultation bilaterally, no wheezing, rhonchi or rales Abd: soft, nontender, no hepatomegaly Ext: no edema Skin: warm and dry Neuro:  CNs 2-12 intact, no focal abnormalities noted  EKG:  Sinus rhythm with atrial arrhythmia and frequent PACs, heart rate 90, normal axis, nonspecific ST-T wave changes  ASSESSMENT AND PLAN:

## 2010-06-15 NOTE — Telephone Encounter (Signed)
Forgot to schedule while she was here, but patient needs carotid dopplers arranged. Dx is carotid bruit and prior h/o stroke.

## 2010-06-15 NOTE — Telephone Encounter (Signed)
Pt is still having side effects from flecinide and son wants to talk to someone because he is not sure if pt needs to go to hospital or what

## 2010-06-15 NOTE — Assessment & Plan Note (Signed)
She has a h/o stroke with significant intracranial cerebrovascular disease.  Carotid bruit was documented during her evaluation in the hospital.  I do not see that she has had carotid dopplers.  It was planned to get this as an outpatient.  Will arrange dopplers to be done prior to follow up.

## 2010-06-15 NOTE — Assessment & Plan Note (Signed)
Uncontrolled.  Hold off on adjusting medications given the current situation with side effects from flecainide.  She sees her PCP back this week.  She is now off potassium.  She can have this rechecked with her PCP to make her that she is not hypokalemic.

## 2010-06-15 NOTE — Telephone Encounter (Addendum)
Since d/c from the hospital, patient has been sick on her stomach and feels like she is "jerking" on the inside. She was not able to clearly explain this symptom to me. She saw Dr. Rodena Medin 06/12/10 and he stopped her Potassium but she is still feeling bad. She was started on Flecainide 50 mg 1 tablet by mouth b.i.d 05/11/10 for atrial tachycardia and has had these symptoms since. She is supposed to see our PA, Tereso Newcomer tomorrow 06/16/10 but I have added her on to see him today 06/15/10 @ 2:15 pm.

## 2010-06-15 NOTE — Assessment & Plan Note (Addendum)
Her rate is currently in the 90s.  She is having significant side effects to flecainide.  She never presented for follow up stress testing due to her symptoms (she did not feel she could proceed with stress testing).  She is only on Flecainide 50 mg twice daily.  Discontinue flecainide.  I discussed this with Dr. Graciela Husbands who agreed.  No further recommendations at this time.  If she presents back with rapid arrhythmias, she will likely need pacemaker to allow rate controlling medications.  Follow up with Dr. Shirlee Latch in 6 weeks.

## 2010-06-15 NOTE — Telephone Encounter (Signed)
Pt aware. Pt states that she is not feeling any better and doesn't feel like she will make it through the day. Pt advised to go to ED if she feels the need, otherwise keep cardio appt tomorrow

## 2010-06-16 ENCOUNTER — Ambulatory Visit: Payer: MEDICARE | Admitting: Physician Assistant

## 2010-06-16 ENCOUNTER — Telehealth: Payer: Self-pay | Admitting: Physician Assistant

## 2010-06-16 NOTE — Telephone Encounter (Signed)
Message copied by Tereso Newcomer on Tue Jun 16, 2010  4:31 PM ------      Message from: Danielle Rankin      Created: Tue Jun 16, 2010 12:28 PM      Regarding: FW: CAROTID       Please see note from Mariane Masters      ----- Message -----         From: Luther Hearing         Sent: 06/16/2010  12:06 PM           To: Roger Shelter. Denyse Amass, CMA      Subject: CAROTID                                                  06/16/10  Okey Regal, Ms.Jordon said she had to much going on,and at this did not want to schedule her carotid.            Gavin Pound

## 2010-06-17 ENCOUNTER — Ambulatory Visit (INDEPENDENT_AMBULATORY_CARE_PROVIDER_SITE_OTHER): Payer: MEDICARE | Admitting: Internal Medicine

## 2010-06-17 ENCOUNTER — Ambulatory Visit: Payer: MEDICARE | Admitting: Internal Medicine

## 2010-06-17 ENCOUNTER — Encounter: Payer: Self-pay | Admitting: Internal Medicine

## 2010-06-17 DIAGNOSIS — R110 Nausea: Secondary | ICD-10-CM

## 2010-06-17 LAB — BASIC METABOLIC PANEL
CO2: 29 mEq/L (ref 19–32)
Calcium: 9.5 mg/dL (ref 8.4–10.5)
Chloride: 104 mEq/L (ref 96–112)
Glucose, Bld: 79 mg/dL (ref 70–99)
Potassium: 4.5 mEq/L (ref 3.5–5.1)
Sodium: 143 mEq/L (ref 135–145)

## 2010-06-17 LAB — CBC WITH DIFFERENTIAL/PLATELET
Basophils Absolute: 0.1 10*3/uL (ref 0.0–0.1)
Eosinophils Absolute: 0.1 10*3/uL (ref 0.0–0.7)
Hemoglobin: 14.9 g/dL (ref 12.0–15.0)
Lymphocytes Relative: 15.1 % (ref 12.0–46.0)
MCHC: 33.3 g/dL (ref 30.0–36.0)
Neutro Abs: 8.1 10*3/uL — ABNORMAL HIGH (ref 1.4–7.7)
Platelets: 201 10*3/uL (ref 150.0–400.0)
RDW: 14.8 % — ABNORMAL HIGH (ref 11.5–14.6)

## 2010-06-17 LAB — HEPATIC FUNCTION PANEL
ALT: 14 U/L (ref 0–35)
AST: 19 U/L (ref 0–37)
Albumin: 3.8 g/dL (ref 3.5–5.2)
Alkaline Phosphatase: 57 U/L (ref 39–117)
Bilirubin, Direct: 0.1 mg/dL (ref 0.0–0.3)
Total Bilirubin: 0.7 mg/dL (ref 0.3–1.2)
Total Protein: 6.4 g/dL (ref 6.0–8.3)

## 2010-06-17 NOTE — Progress Notes (Signed)
  Subjective:    Patient ID: [name] [name], female    DOB: 1923/04/21, 77 y.o.   MRN: [id number]  HPIhere for f/u with husband and son Main complaint is nausea---seems to have started with flecainide (pt is convinced) She also has a dizzy sensation ("i don't feel right inside")  htn--home bps 131-160/60-77  Nausea---taking prn zofran with some relief.   Potassium discontinued last week Reviewed dr hodgin's note and [facility] note  Past Medical History  Diagnosis Date  . Hypertension   . Osteopenia   . Cerebrovascular disease   . Angioedema     2/2 ACE inhibitors  . Arthritis   . Stroke   . Mild memory disturbances not amounting to dementia   . Atrial tachycardia     a.  eval for syncope 3/12 with event monitor that demonstrated WCT felt to likely be ATach with aberrancy and Fleacainide started in 4/12;   b.  echo 05/08/10: EF 55-60%, mod LVH, trivial AS (AVA 1.4, mean gradient 24), trivial AI, mod MAC with mean gradient 10 mmHg, mild to mod LAE and mild RVH   Past Surgical History  Procedure Date  . Abdominal hysterectomy 1963    unilateral oophorectomy  . Bunionectomy 1986  . Dilation and curettage of uterus 1962    reports that she has quit smoking. She does not have any smokeless tobacco history on file. She reports that she drinks alcohol. Her drug history not on file. family history includes Heart disease in her father and mother.  There is no history of Colon cancer. Allergies  Allergen Reactions  . Ace Inhibitors     REACTION: angioedema  . Famotidine     REACTION: tongue swelling  . Felodipine     REACTION: ? angioedma after taking for 26 days---UNCLEAR  . Fexofenadine     REACTION: knees buckled  . Flecainide Nausea Only  . Lisinopril     REACTION: itching, rash  . Metoprolol Tartrate     REACTION: vertigo  . Penicillins     REACTION: rash  . Pneumococcal Vaccine Polyvalent     REACTION: unspecified     Review of Systems  patient denies chest  pain, shortness of breath, orthopnea. Denies lower extremity edema, abdominal pain, change in appetite, change in bowel movements. Patient denies rashes, musculoskeletal complaints. No other specific complaints in a complete review of systems.      Objective:   Physical Exam  Well-developed well-nourished female in no acute distress. HEENT exam atraumatic, normocephalic, extraocular muscles are intact. Neck is supple. No jugular venous distention no thyromegaly. Chest clear to auscultation without increased work of breathing. Cardiac exam S1 and S2 are irregular, 2/6 hsm. Abdominal exam active bowel sounds, soft, nontender. Extremities no edema. Neurologic exam she is alert without any motor sensory deficits. Gait is normal.      Assessment & Plan:  Patient with complaint of nausea and dizziness. This may be vertigo. She is convinced is related to other medications. Those have been discontinued. I don't think she has had a recurrent stroke. I think it's best to watch for symptoms for the time being. She will call me if symptoms persist. Otherwise we'll see her as needed.

## 2010-06-19 ENCOUNTER — Ambulatory Visit: Payer: MEDICARE | Admitting: Cardiology

## 2010-06-20 ENCOUNTER — Other Ambulatory Visit: Payer: Self-pay | Admitting: Family Medicine

## 2010-06-20 ENCOUNTER — Ambulatory Visit (INDEPENDENT_AMBULATORY_CARE_PROVIDER_SITE_OTHER): Payer: Medicare Other | Admitting: Family Medicine

## 2010-06-20 VITALS — BP 140/60 | HR 67 | Temp 98.5°F | Wt 135.0 lb

## 2010-06-20 DIAGNOSIS — R531 Weakness: Secondary | ICD-10-CM

## 2010-06-20 DIAGNOSIS — R5383 Other fatigue: Secondary | ICD-10-CM

## 2010-06-20 DIAGNOSIS — R5381 Other malaise: Secondary | ICD-10-CM

## 2010-06-20 LAB — POCT URINALYSIS DIPSTICK
Glucose, UA: NEGATIVE
Nitrite, UA: NEGATIVE
Urobilinogen, UA: NEGATIVE
pH, UA: 6

## 2010-06-20 MED ORDER — CIPROFLOXACIN HCL 250 MG PO TABS: 250.0000 mg | ORAL_TABLET | Freq: Two times a day (BID) | ORAL | Status: AC

## 2010-06-20 NOTE — Patient Instructions (Signed)
Drink more fluids. Start antibiotic and take regularly until complete Avoid further use of flecainide

## 2010-06-20 NOTE — Progress Notes (Signed)
  Subjective:    Patient ID: [name] [name], female    DOB: April 11, 1923, 77 y.o.   MRN: [id number]  HPI Patient seen Saturday morning work in clinic with nonspecific symptoms of some nausea, weakness, and dizziness. Recently treated with flecainide and this was discontinued. She was feeling somewhat better and thinks that she accidentally took a leftover tablet this morning. Symptoms seem to worsen after that. She has not had any urinary symptoms, fever, chills, chest pain, dyspnea, or cough. Does have some nausea but no vomiting. Recent lab work a couple days ago basic metabolic panel, hepatic panel, and CBC all basically unremarkable. She has been keeping down some fluids and ate some toast this morning and kept down. Has Zofran for nausea but has not taken any this morning   Review of Systems  Constitutional: Positive for fatigue. Negative for fever and chills.  HENT: Negative for congestion, sore throat, trouble swallowing and neck pain.   Respiratory: Negative for cough, shortness of breath and wheezing.   Cardiovascular: Negative for chest pain, palpitations and leg swelling.  Gastrointestinal: Positive for nausea. Negative for vomiting, abdominal pain and diarrhea.  Genitourinary: Negative for hematuria and flank pain.  Skin: Negative for rash.  Neurological: Negative for headaches.  Hematological: Negative for adenopathy.       Objective:   Physical Exam  Constitutional: She is oriented to person, place, and time. She appears well-developed and well-nourished.  HENT:  Right Ear: External ear normal.  Left Ear: External ear normal.  Mouth/Throat: Oropharynx is clear and moist. No oropharyngeal exudate.  Neck: Neck supple. No thyromegaly present.  Cardiovascular: Normal rate and regular rhythm.   Pulmonary/Chest: Breath sounds normal. No respiratory distress. She has no wheezes. She has no rales.  Musculoskeletal: She exhibits no edema.  Lymphadenopathy:    She has no cervical  adenopathy.  Neurological: She is alert and oriented to person, place, and time.  Skin: No rash noted.  Psychiatric: She has a normal mood and affect.          Assessment & Plan:  Patient presents with several vague symptoms including nausea, weakness, dizziness possibly after taking repeat dose of flecainide. Does have some change in systolic blood pressure supine to standing from 158 to 140 but no significant orthostasis. Minimally elevated white count on recent CBC. Check urinalysis. Avoid further use of flecainide.  Urine dip leukocytes, mod ketones, trace blood.  Will cx and cover with Cipro 250 mg bid for 7 days.

## 2010-06-21 ENCOUNTER — Encounter: Payer: Self-pay | Admitting: Family Medicine

## 2010-06-23 NOTE — Progress Notes (Signed)
Quick Note:  Pt informed ______ 

## 2010-06-25 ENCOUNTER — Encounter: Payer: Self-pay | Admitting: Internal Medicine

## 2010-06-25 ENCOUNTER — Telehealth: Payer: Self-pay | Admitting: Cardiology

## 2010-06-25 ENCOUNTER — Ambulatory Visit (INDEPENDENT_AMBULATORY_CARE_PROVIDER_SITE_OTHER): Payer: Medicare Other | Admitting: Internal Medicine

## 2010-06-25 DIAGNOSIS — I10 Essential (primary) hypertension: Secondary | ICD-10-CM

## 2010-06-25 DIAGNOSIS — F411 Generalized anxiety disorder: Secondary | ICD-10-CM

## 2010-06-25 DIAGNOSIS — R42 Dizziness and giddiness: Secondary | ICD-10-CM

## 2010-06-25 DIAGNOSIS — R110 Nausea: Secondary | ICD-10-CM

## 2010-06-25 DIAGNOSIS — I6529 Occlusion and stenosis of unspecified carotid artery: Secondary | ICD-10-CM

## 2010-06-25 MED ORDER — SERTRALINE HCL 25 MG PO TABS: 25.0000 mg | ORAL_TABLET | Freq: Every day | ORAL | Status: DC

## 2010-06-25 MED ORDER — LORAZEPAM 0.5 MG PO TABS: 0.5000 mg | ORAL_TABLET | Freq: Every day | ORAL | Status: DC

## 2010-06-25 NOTE — Telephone Encounter (Signed)
PT IS STILL HAVING POUNDING HER CHEST SO HER AND HER SON NEED TO KNOW WHAT TO DO. SHE WAS SEEN BY [facility] 2WKS AGO AND HAD SOME MEDICATION CHANGES AND WAS TOLD TO CALL IF SYMPTOMS ARE NOT RESOLVED.

## 2010-06-25 NOTE — Telephone Encounter (Signed)
Spoke with pt and son. Pt reports she has had several episodes of feeling swimmy headed and with rapid heart rate since office visit with Tereso Newcomer, PA. Last episode was this AM. Pt states she has appointment with Dr. Amador Cunas today at 3:30 to have this evaluated.  She states most recent heart rate today was 90. Pt states she feels well enough to go to Dr. Vernon Prey office and does not feel she needs to go to ED. I instructed her to keep scheduled appt but to go to ED at [facility] if symptoms were to worsen.  I told pt that our office is in same medical records system and Dr. Lesia Hausen would have access to office notes from visits here.

## 2010-06-25 NOTE — Progress Notes (Signed)
  Subjective:    Patient ID: [name] [name], female    DOB: [birth year], 77 y.o.   MRN: [id number]  HPI  77 year old patient who is seen today for followup. She has a complex past medical history and has seen a number of providers recently she was hospitalized due to 2 syncope and bradycardia arrhythmia. She was also noted to have a wide-complex tachycardia felt related to atrial tachycardia with aberration. She was placed on flecainide and did poorly with nausea and a general sense of unwellness. This was discontinued. She tends to have nausea with poor appetite and GI upset. She complains of dizziness and a sense of nervousness and "trembling on the inside". She does have a history of an anxiety disorder. She has treated hypertension. She was seen at the [facility] and is completing Cipro for a UTI. 2-D echocardiogram has revealed normal LV function. She has  trivial aortic stenosis.    Review of Systems  Constitutional: Positive for fatigue.  HENT: Negative for hearing loss, congestion, sore throat, rhinorrhea, dental problem, sinus pressure and tinnitus.   Eyes: Negative for pain, discharge and visual disturbance.  Respiratory: Negative for cough and shortness of breath.   Cardiovascular: Negative for chest pain, palpitations and leg swelling.  Gastrointestinal: Positive for nausea. Negative for vomiting, abdominal pain, diarrhea, constipation, blood in stool and abdominal distention.  Genitourinary: Negative for dysuria, urgency, frequency, hematuria, flank pain, vaginal bleeding, vaginal discharge, difficulty urinating, vaginal pain and pelvic pain.  Musculoskeletal: Negative for joint swelling, arthralgias and gait problem.  Skin: Negative for rash.  Neurological: Negative for dizziness, syncope, speech difficulty, weakness, numbness and headaches.  Hematological: Negative for adenopathy.  Psychiatric/Behavioral: Negative for behavioral problems, dysphoric mood and agitation. The  patient is nervous/anxious.        Objective:   Physical Exam  Constitutional: She is oriented to person, place, and time. She appears well-developed and well-nourished.       Blood pressure 160/72  HENT:  Head: Normocephalic.  Right Ear: External ear normal.  Left Ear: External ear normal.  Mouth/Throat: Oropharynx is clear and moist.  Eyes: Conjunctivae and EOM are normal. Pupils are equal, round, and reactive to light.  Neck: Normal range of motion. Neck supple. No thyromegaly present.       Bilateral carotid bruits  Cardiovascular: Normal rate and normal heart sounds.        Irregular rhythm with a controlled ventricular response  Pulmonary/Chest: Effort normal and breath sounds normal.  Abdominal: Soft. Bowel sounds are normal. She exhibits no mass. There is no tenderness.  Musculoskeletal: Normal range of motion. She exhibits no edema.  Lymphadenopathy:    She has no cervical adenopathy.  Neurological: She is alert and oriented to person, place, and time.  Skin: Skin is warm and dry. No rash noted.  Psychiatric: She has a normal mood and affect. Her behavior is normal.          Assessment & Plan:

## 2010-06-25 NOTE — Patient Instructions (Signed)
Limit your sodium (Salt) intake  Follow with Dr. Cato Mulligan in 2 weeks

## 2010-06-25 NOTE — Progress Notes (Signed)
  Subjective:    Patient ID: [name] [name], female    DOB: 03-24-1923, 77 y.o.   MRN: [id number]  HPI  Wt Readings from Last 3 Encounters:  06/25/10 137 lb (62.143 kg)  06/20/10 135 lb (61.236 kg)  06/17/10 135 lb (61.236 kg)     Review of Systems     Objective:   Physical Exam        Assessment & Plan:

## 2010-07-01 ENCOUNTER — Telehealth: Payer: Self-pay | Admitting: *Deleted

## 2010-07-06 ENCOUNTER — Encounter: Payer: Self-pay | Admitting: Internal Medicine

## 2010-07-06 ENCOUNTER — Telehealth: Payer: Self-pay | Admitting: *Deleted

## 2010-07-06 ENCOUNTER — Ambulatory Visit (INDEPENDENT_AMBULATORY_CARE_PROVIDER_SITE_OTHER): Payer: Medicare Other | Admitting: Internal Medicine

## 2010-07-06 DIAGNOSIS — I10 Essential (primary) hypertension: Secondary | ICD-10-CM

## 2010-07-06 DIAGNOSIS — L0291 Cutaneous abscess, unspecified: Secondary | ICD-10-CM

## 2010-07-06 DIAGNOSIS — F411 Generalized anxiety disorder: Secondary | ICD-10-CM

## 2010-07-06 DIAGNOSIS — K589 Irritable bowel syndrome without diarrhea: Secondary | ICD-10-CM

## 2010-07-06 DIAGNOSIS — L0390 Cellulitis, unspecified: Secondary | ICD-10-CM

## 2010-07-06 DIAGNOSIS — R110 Nausea: Secondary | ICD-10-CM

## 2010-07-06 MED ORDER — OLMESARTAN MEDOXOMIL 20 MG PO TABS: 20.0000 mg | ORAL_TABLET | Freq: Every day | ORAL | Status: DC

## 2010-07-06 MED ORDER — CIPROFLOXACIN HCL 500 MG PO TABS: 500.0000 mg | ORAL_TABLET | Freq: Two times a day (BID) | ORAL | Status: DC

## 2010-07-06 MED ORDER — AMLODIPINE BESYLATE 5 MG PO TABS: 5.0000 mg | ORAL_TABLET | Freq: Every day | ORAL | Status: DC

## 2010-07-06 NOTE — Telephone Encounter (Signed)
Spoke with Designer, jewellery - pt is on tambocor 50mg  bid - has hx of arrythmia - interaction with cipro . Will make possibly worse.  Please advise

## 2010-07-06 NOTE — Progress Notes (Signed)
  Subjective:    Patient ID: [name] [name], female    DOB: Sep 15, 1923, 77 y.o.   MRN: [id number]  HPI  77 year old patient who is seen today for followup. She presents with a chief complaint of nausea and dizziness. She states this has been present for 2 weeks but review of her office notes suggest this has been going on for a much longer period of time. She attributes this to her blood pressure medication and she states that she has put this on hold for 2 days. Her sertraline is in the process of being tapered apparently because of possibility of nausea as a side effect. She has a history of atrial tachycardia and hypertension which have been stable. She also has a soft tissue infection involving her left wrist area at the site of trauma from a twig. Denies any fever chills or other constitutional complaints. She does have a penicillin allergy    Review of Systems  HENT: Negative for hearing loss, congestion, sore throat, rhinorrhea, dental problem, sinus pressure and tinnitus.   Eyes: Negative for pain, discharge and visual disturbance.  Respiratory: Negative for cough and shortness of breath.   Cardiovascular: Negative for chest pain, palpitations and leg swelling.  Gastrointestinal: Positive for nausea. Negative for vomiting, abdominal pain, diarrhea, constipation, blood in stool and abdominal distention.  Genitourinary: Negative for dysuria, urgency, frequency, hematuria, flank pain, vaginal bleeding, vaginal discharge, difficulty urinating, vaginal pain and pelvic pain.  Musculoskeletal: Negative for joint swelling, arthralgias and gait problem.  Skin: Positive for wound. Negative for rash.  Neurological: Positive for weakness and light-headedness. Negative for dizziness, syncope, speech difficulty, numbness and headaches.  Hematological: Negative for adenopathy.  Psychiatric/Behavioral: Negative for behavioral problems, dysphoric mood and agitation. The patient is not nervous/anxious.         Objective:   Physical Exam  Constitutional: She is oriented to person, place, and time. She appears well-developed and well-nourished.  HENT:  Head: Normocephalic.  Right Ear: External ear normal.  Left Ear: External ear normal.  Mouth/Throat: Oropharynx is clear and moist.  Eyes: Conjunctivae and EOM are normal. Pupils are equal, round, and reactive to light.  Neck: Normal range of motion. Neck supple. No thyromegaly present.       Carotid bruits versus transmitted murmur  Cardiovascular: Normal rate, regular rhythm and intact distal pulses.   Murmur heard. Pulmonary/Chest: Effort normal and breath sounds normal.  Abdominal: Soft. Bowel sounds are normal. She exhibits no mass. There is no tenderness.  Musculoskeletal: Normal range of motion.  Lymphadenopathy:    She has no cervical adenopathy.  Neurological: She is alert and oriented to person, place, and time.  Skin: Skin is warm and dry. No rash noted.       Patient had a small puncture wound involving the radial aspect of the left wrist there is a proximal 5 cm area of surrounding erythema with excessive warmth. This was consistent with a early cellulitis  Psychiatric: She has a normal mood and affect. Her behavior is normal.          Assessment & Plan:   Hypertension well controlled. The patient's blood pressure medicines have been on hold for 2 days we'll decrease her amlodipine to 5 mg daily and the Benicar to 20 mg daily. We'll recheck in 2 weeks Cellulitis left arm. We'll treat with Cipro 500 twice a day for 7 days Nausea weakness dizziness. Helpful to improve with titration of her blood pressure medication Anxiety disorder

## 2010-07-06 NOTE — Telephone Encounter (Signed)
Pt made appt today with Dr Kirtland Bouchard

## 2010-07-06 NOTE — Patient Instructions (Signed)
Return office visit 2 weeks  Take your antibiotic as prescribed until ALL of it is gone, but stop if you develop a rash, swelling, or any side effects of the medication.  Contact our office as soon as possible if  there are side effects of the medication.

## 2010-07-06 NOTE — Telephone Encounter (Signed)
Still had not received her antibiotic from this am.  Wants to speak to [facility].  States the drug store was supposed to get in touch with her or Dr. Kirtland Bouchard about the antibiotic?????

## 2010-07-07 ENCOUNTER — Telehealth: Payer: Self-pay

## 2010-07-07 MED ORDER — MOXIFLOXACIN HCL 400 MG PO TABS: 400.0000 mg | ORAL_TABLET | Freq: Every day | ORAL | Status: DC

## 2010-07-07 MED ORDER — DOXYCYCLINE HYCLATE 100 MG PO TABS: 100.0000 mg | ORAL_TABLET | Freq: Two times a day (BID) | ORAL | Status: DC

## 2010-07-07 NOTE — Telephone Encounter (Signed)
avelox 400  #7  One daily

## 2010-07-07 NOTE — Telephone Encounter (Signed)
rx new med - called pt. KIK

## 2010-07-07 NOTE — Telephone Encounter (Signed)
Doxycycline 100 mg #20 to take 1 twice a day

## 2010-07-07 NOTE — Telephone Encounter (Signed)
avelox still in the quinalone  family - will have same interaction - please review.

## 2010-07-07 NOTE — Telephone Encounter (Signed)
Called [facility] with new rx doxycycline

## 2010-07-09 ENCOUNTER — Ambulatory Visit: Payer: Medicare Other | Admitting: Internal Medicine

## 2010-07-13 ENCOUNTER — Ambulatory Visit (INDEPENDENT_AMBULATORY_CARE_PROVIDER_SITE_OTHER): Payer: Medicare Other | Admitting: Internal Medicine

## 2010-07-13 ENCOUNTER — Encounter: Payer: Self-pay | Admitting: Internal Medicine

## 2010-07-13 VITALS — BP 122/74 | Temp 98.4°F | Wt 135.0 lb

## 2010-07-13 DIAGNOSIS — L03114 Cellulitis of left upper limb: Secondary | ICD-10-CM

## 2010-07-13 DIAGNOSIS — IMO0002 Reserved for concepts with insufficient information to code with codable children: Secondary | ICD-10-CM

## 2010-07-13 NOTE — Patient Instructions (Signed)
Keep left arm elevated as much as possible Avelox one daily for one week  Call or return to clinic prn if these symptoms worsen or fail to improve as anticipated.

## 2010-07-13 NOTE — Progress Notes (Signed)
  Subjective:    Patient ID: [name] [name], female    DOB: 1923-11-23, 76 y.o.   MRN: [id number]  HPI 77 year old patient who is seen today for followup. She seen last week for a cellulitis involving the distal left arm after sustaining trauma on a tree branch. She was placed on doxycycline due to penicillin allergy. Quinolone therapy was relatively contraindicated due to the patient's flecainide treatment. The flecainide has subsequently been discontinued by cardiology. She presents today for followup with worsening pain and swelling involving the radial aspect of the distal left arm    Review of Systems  Skin: Positive for rash and wound.       Objective:   Physical Exam  Skin:       Approximate 8 cm area of erythema involving the radial aspect of the distal left arm. The overlying skin was weeping with some vesicles.          Assessment & Plan:   Probable contact dermatitis with secondary cellulitis. We'll treat with Avapro for 7 days. Samples were provided; will  attempt to keep the left arm elevated

## 2010-07-16 ENCOUNTER — Ambulatory Visit (INDEPENDENT_AMBULATORY_CARE_PROVIDER_SITE_OTHER): Payer: Medicare Other | Admitting: Family Medicine

## 2010-07-16 ENCOUNTER — Encounter: Payer: Self-pay | Admitting: Family Medicine

## 2010-07-16 VITALS — BP 150/70 | Temp 98.8°F | Wt 136.0 lb

## 2010-07-16 DIAGNOSIS — L03119 Cellulitis of unspecified part of limb: Secondary | ICD-10-CM

## 2010-07-16 DIAGNOSIS — L02519 Cutaneous abscess of unspecified hand: Secondary | ICD-10-CM

## 2010-07-16 MED ORDER — MUPIROCIN 2 % EX OINT: TOPICAL_OINTMENT | CUTANEOUS | Status: DC

## 2010-07-16 NOTE — Progress Notes (Signed)
  Subjective:    Patient ID: [name] [name], female    DOB: 07-05-1923, 77 y.o.   MRN: [id number]  HPI Followup left forearm rash and injury. Trauma to tree branch. No suspicion for foreign body. Initially placed on doxycycline and subsequently Avelox. Redness not much change compared a few days ago. Denies pain or itching. Using Neosporin topically. Some yellowish crusted drainage. No fever or chills.   Review of Systems  Constitutional: Negative for fever and chills.  Skin: Positive for rash and wound.       Objective:   Physical Exam  Constitutional: She appears well-developed and well-nourished.  Cardiovascular: Normal rate and regular rhythm.   Pulmonary/Chest: Effort normal and breath sounds normal. No respiratory distress. She has no wheezes. She has no rales.  Musculoskeletal:       Left forearm reveals area of erythema approximately 4 x 7 cm. Superficial abrasion. She has some yellow crusted drainage on the surface. Nontender. Not warm to touch  Neurological: She is alert.          Assessment & Plan:  Left forearm abrasion with cellulitis changes. Bactroban ointment added and continued Avelox and followup with primary Monday.  Leave off Neosporin

## 2010-07-16 NOTE — Patient Instructions (Signed)
Leave off neosporin. Keep clean with soap and water. Finish out oral antibiotic.

## 2010-07-17 ENCOUNTER — Telehealth: Payer: Self-pay | Admitting: *Deleted

## 2010-07-17 ENCOUNTER — Other Ambulatory Visit: Payer: Self-pay | Admitting: *Deleted

## 2010-07-17 MED ORDER — CEPHALEXIN 500 MG PO CAPS: 500.0000 mg | ORAL_CAPSULE | Freq: Three times a day (TID) | ORAL | Status: AC

## 2010-07-17 NOTE — Telephone Encounter (Signed)
Pt has not been seen in over a year, per Dr Caryl Never, one refill only, will need OV for more.  I did fax Rx to number provided, confirmation received

## 2010-07-17 NOTE — Telephone Encounter (Signed)
Last note in chart was entered in error, wrong Pt.

## 2010-07-17 NOTE — Telephone Encounter (Signed)
D/C Avelox.  Keflex 500 mg po tid for 7 days.

## 2010-07-17 NOTE — Telephone Encounter (Signed)
Avelox is making pt dizzy and nauseated. Karin Golden at [facility].

## 2010-07-17 NOTE — Telephone Encounter (Signed)
Pt called Avelox is making her very nauseated, but needs something for her arm to get better, what to do?

## 2010-07-17 NOTE — Telephone Encounter (Signed)
Pt call back again requesting nancy to call her.

## 2010-07-20 ENCOUNTER — Ambulatory Visit (INDEPENDENT_AMBULATORY_CARE_PROVIDER_SITE_OTHER): Payer: Medicare Other | Admitting: Internal Medicine

## 2010-07-20 ENCOUNTER — Encounter: Payer: Self-pay | Admitting: Internal Medicine

## 2010-07-20 VITALS — BP 180/90 | Temp 98.4°F

## 2010-07-20 DIAGNOSIS — F0390 Unspecified dementia without behavioral disturbance: Secondary | ICD-10-CM

## 2010-07-20 MED ORDER — DONEPEZIL HCL 5 MG PO TABS: 5.0000 mg | ORAL_TABLET | Freq: Every day | ORAL | Status: DC

## 2010-07-20 MED ORDER — DONEPEZIL HCL 10 MG PO TABS: 10.0000 mg | ORAL_TABLET | Freq: Every day | ORAL | Status: DC

## 2010-07-20 NOTE — Progress Notes (Signed)
  Subjective:    Patient ID: [name] [name], female    DOB: 1924/01/20, 77 y.o.   MRN: [id number]  HPI Complicated pt Difficult to control BP 160s/70 at home  Hx of stroke---still living at home with elderly husband  There has been some concern about dementia.    Review of Systems     Objective:   Physical Exam June, (might be the end of may), 2001, [place], [place], [place], [place],  ? Pres, [facility] [place], ? [place]. Learning 3/3 Pen recognize, knows what to do with it Serial 7s: 93, can't complete Dlrow: 1/5 Memory: 1/3        Assessment & Plan:

## 2010-07-20 NOTE — Assessment & Plan Note (Signed)
Discussed at length with patient and son Discussed side effects.

## 2010-07-27 ENCOUNTER — Ambulatory Visit: Payer: MEDICARE | Admitting: Cardiology

## 2010-07-28 ENCOUNTER — Ambulatory Visit: Payer: Medicare Other | Admitting: Internal Medicine

## 2010-08-06 ENCOUNTER — Ambulatory Visit: Payer: Medicare Other | Admitting: Internal Medicine

## 2010-08-06 ENCOUNTER — Ambulatory Visit (INDEPENDENT_AMBULATORY_CARE_PROVIDER_SITE_OTHER): Payer: Medicare Other | Admitting: Internal Medicine

## 2010-08-06 ENCOUNTER — Encounter: Payer: Self-pay | Admitting: Internal Medicine

## 2010-08-06 VITALS — BP 142/64 | HR 80 | Temp 98.2°F | Wt 134.0 lb

## 2010-08-06 DIAGNOSIS — F0390 Unspecified dementia without behavioral disturbance: Secondary | ICD-10-CM

## 2010-08-06 NOTE — Progress Notes (Signed)
  Subjective:    Patient ID: [name] [name], female    DOB: [birth year], 77 y.o.   MRN: [id number]  HPI  She complains of loose stools. "sometimes i can't make it" .  HTN---tolerating meds. BPs at home--150-140/60-70.   Memory---started aricept. Timing may consistent with loose stools. She is still taking 5 mg aricept  Past Medical History  Diagnosis Date  . Hypertension   . Osteopenia   . Cerebrovascular disease   . Angioedema     2/2 ACE inhibitors  . Arthritis   . Stroke   . Mild memory disturbances not amounting to dementia   . Atrial tachycardia     a.  eval for syncope 3/12 with event monitor that demonstrated WCT felt to likely be ATach with aberrancy and Fleacainide started in 4/12;   b.  echo 05/08/10: EF 55-60%, mod LVH, trivial AS (AVA 1.4, mean gradient 24), trivial AI, mod MAC with mean gradient 10 mmHg, mild to mod LAE and mild RVH   Past Surgical History  Procedure Date  . Abdominal hysterectomy 1963    unilateral oophorectomy  . Bunionectomy 1986  . Dilation and curettage of uterus 1962    reports that she has quit smoking. She does not have any smokeless tobacco history on file. She reports that she drinks alcohol. Her drug history not on file. family history includes Heart disease in her father and mother.  There is no history of Colon cancer. Allergies  Allergen Reactions  . Ace Inhibitors     REACTION: angioedema  . Famotidine     REACTION: tongue swelling  . Felodipine     REACTION: ? angioedma after taking for 26 days---UNCLEAR  . Fexofenadine     REACTION: knees buckled  . Flecainide Nausea Only  . Lisinopril     REACTION: itching, rash  . Metoprolol Tartrate     REACTION: vertigo  . Penicillins     REACTION: rash  . Pneumococcal Vaccine Polyvalent     REACTION: unspecified     Review of Systems  patient denies chest pain, shortness of breath, orthopnea. Denies lower extremity edema, abdominal pain, change in appetite, change in bowel  movements. Patient denies rashes, musculoskeletal complaints. No other specific complaints in a complete review of systems.      Objective:   Physical Exam  Well-developed well-nourished female in no acute distress. HEENT exam atraumatic, normocephalic, extraocular muscles are intact. Neck is supple. No jugular venous distention no thyromegaly. Chest clear to auscultation without increased work of breathing. Cardiac exam S1 and S2 are regular. Abdominal exam active bowel sounds, soft, nontender. Extremities no edema. Neurologic exam she is alert without any motor sensory deficits. Gait is broad based        Assessment & Plan:

## 2010-08-06 NOTE — Assessment & Plan Note (Signed)
i suspect aricept is causing/contributing to loose stools Will d/c  stopped with pt, husband and son

## 2010-08-07 ENCOUNTER — Telehealth: Payer: Self-pay | Admitting: *Deleted

## 2010-08-07 NOTE — Telephone Encounter (Signed)
Pt woke up this morning and she was dizzy and nauseated.  She said her legs are weak and she can't stand up.  Pt took her bp it was 165/63 after taking her meds

## 2010-08-07 NOTE — Telephone Encounter (Signed)
Pt already has zofran 4mg .  Son will give it to her as instructed by Dr Cato Mulligan

## 2010-08-07 NOTE — Telephone Encounter (Signed)
Trial zofran 4 mg po q 8 hours prn. If sxs worsen/persist she should seek medical attn

## 2010-08-20 ENCOUNTER — Ambulatory Visit (INDEPENDENT_AMBULATORY_CARE_PROVIDER_SITE_OTHER): Payer: Medicare Other | Admitting: Family Medicine

## 2010-08-20 ENCOUNTER — Encounter: Payer: Self-pay | Admitting: Family Medicine

## 2010-08-20 ENCOUNTER — Other Ambulatory Visit: Payer: Self-pay | Admitting: Family Medicine

## 2010-08-20 VITALS — BP 130/78 | HR 91 | Temp 98.2°F | Wt 132.0 lb

## 2010-08-20 DIAGNOSIS — L259 Unspecified contact dermatitis, unspecified cause: Secondary | ICD-10-CM

## 2010-08-20 DIAGNOSIS — R630 Anorexia: Secondary | ICD-10-CM

## 2010-08-20 DIAGNOSIS — R110 Nausea: Secondary | ICD-10-CM

## 2010-08-20 MED ORDER — TRIAMCINOLONE ACETONIDE 0.1 % EX CREA: TOPICAL_CREAM | Freq: Two times a day (BID) | CUTANEOUS | Status: DC

## 2010-08-20 NOTE — Progress Notes (Signed)
  Subjective:    Patient ID: [name] [name], female    DOB: 12/28/1923, 77 y.o.   MRN: [id number]  HPI Here for several issues. First she has had a rash on thelefyt hand for 6 weeks after a tree branch struck her. She had 2 courses of oral antibiotics, and she has been applying Mupircin ointment daily. At first she had cellulitis down the entire forearm, but this improved and now only the hand is involved. Also she has had some nausea and decreased appetite for 2 weeks. No abdominal pain or fever or nausea. Using Zofran at times.    Review of Systems  Constitutional: Positive for appetite change.  Respiratory: Negative.   Cardiovascular: Negative.   Gastrointestinal: Positive for nausea. Negative for vomiting, abdominal pain, diarrhea, constipation and blood in stool.  Skin: Positive for rash.       Objective:   Physical Exam  Constitutional: She appears well-developed and well-nourished.  Cardiovascular: Normal rate, regular rhythm, normal heart sounds and intact distal pulses.   Pulmonary/Chest: Effort normal and breath sounds normal.  Abdominal: Soft. Bowel sounds are normal. She exhibits no distension. There is no tenderness.  Skin:       The dorsal left hand has a maculopapular red rash           Assessment & Plan:  The rash appears to be more of a contact dermatitis now, and I think the bacterial infection has resolved. Will stop the Mupiricin, and start Triamcinolone cream bid. The nausea could be from some GERD, so I suggested they try Prilosec OTC daily

## 2010-08-31 ENCOUNTER — Ambulatory Visit: Payer: Medicare Other | Admitting: Internal Medicine

## 2010-09-04 ENCOUNTER — Ambulatory Visit (INDEPENDENT_AMBULATORY_CARE_PROVIDER_SITE_OTHER): Payer: Medicare Other | Admitting: Family Medicine

## 2010-09-04 ENCOUNTER — Encounter: Payer: Self-pay | Admitting: Family Medicine

## 2010-09-04 DIAGNOSIS — G4700 Insomnia, unspecified: Secondary | ICD-10-CM

## 2010-09-04 DIAGNOSIS — H6120 Impacted cerumen, unspecified ear: Secondary | ICD-10-CM

## 2010-09-04 DIAGNOSIS — F411 Generalized anxiety disorder: Secondary | ICD-10-CM

## 2010-09-04 MED ORDER — LORAZEPAM 0.5 MG PO TABS: 0.5000 mg | ORAL_TABLET | Freq: Every day | ORAL | Status: DC

## 2010-09-04 NOTE — Progress Notes (Signed)
  Subjective:    Patient ID: [name] [name], female    DOB: 05-Jun-1923, 77 y.o.   MRN: [id number]  HPI Patient seen as a work in for the following issues. One day history of right ear fullness. Concern for cerumen impaction. No left ear symptoms. No drainage. No ear pain. Denies vertigo.  Chronic history of intermittent insomnia and increased anxiety and agitation. Takes alprazolam 0.5 mg as needed usually about once per day. No recent falls. Son helps regulate her medications. She does have some history of reported dementia   Review of Systems  Constitutional: Negative for fever and chills.  HENT: Negative for ear pain, congestion, tinnitus and ear discharge.   Respiratory: Negative for cough and shortness of breath.   Cardiovascular: Negative for chest pain.  Psychiatric/Behavioral: Negative for dysphoric mood and agitation. The patient is nervous/anxious.        Objective:   Physical Exam  Constitutional: She appears well-developed and well-nourished.  HENT:  Mouth/Throat: Oropharynx is clear and moist.       Right canal is impacted with cerumen. Left canal is clear Removed with irrigation. TM looks OK.  Cardiovascular: Normal rate and regular rhythm.   Pulmonary/Chest: Effort normal and breath sounds normal. No respiratory distress. She has no wheezes. She has no rales.  Neurological: She is alert.          Assessment & Plan:  #1 right canal cerumen impaction. Irrigate #2 chronic anxiety and insomnia. Recommended not escalating lorazepam and discuss possible risk of falls at her age. Refilled lorazepam 0.5 mg 1 each bedtime as needed

## 2010-09-10 ENCOUNTER — Other Ambulatory Visit: Payer: Self-pay | Admitting: Family Medicine

## 2010-09-11 NOTE — Telephone Encounter (Signed)
Dr. Swords pt 

## 2010-09-16 ENCOUNTER — Telehealth: Payer: Self-pay | Admitting: *Deleted

## 2010-09-16 DIAGNOSIS — H9313 Tinnitus, bilateral: Secondary | ICD-10-CM

## 2010-09-16 NOTE — Telephone Encounter (Signed)
ok 

## 2010-09-16 NOTE — Telephone Encounter (Signed)
Son would like a referral to ENT for pt.  She is complaining of a roaring in her ears.

## 2010-09-17 ENCOUNTER — Telehealth: Payer: Self-pay | Admitting: *Deleted

## 2010-09-17 NOTE — Telephone Encounter (Signed)
ENT Consult per Dr. Cato Mulligan ordered today.

## 2010-09-21 ENCOUNTER — Ambulatory Visit (INDEPENDENT_AMBULATORY_CARE_PROVIDER_SITE_OTHER): Payer: Medicare Other | Admitting: Internal Medicine

## 2010-09-21 ENCOUNTER — Ambulatory Visit: Payer: Medicare Other | Admitting: Internal Medicine

## 2010-09-21 ENCOUNTER — Encounter: Payer: Self-pay | Admitting: Internal Medicine

## 2010-09-21 DIAGNOSIS — T783XXA Angioneurotic edema, initial encounter: Secondary | ICD-10-CM

## 2010-09-21 DIAGNOSIS — I6350 Cerebral infarction due to unspecified occlusion or stenosis of unspecified cerebral artery: Secondary | ICD-10-CM

## 2010-09-21 DIAGNOSIS — I10 Essential (primary) hypertension: Secondary | ICD-10-CM

## 2010-09-21 MED ORDER — OLMESARTAN MEDOXOMIL 20 MG PO TABS: 20.0000 mg | ORAL_TABLET | Freq: Every day | ORAL | Status: DC

## 2010-09-21 NOTE — Progress Notes (Signed)
  Subjective:    Patient ID: [name] [name], female    DOB: 1923-02-03, 77 y.o.   MRN: [id number]  HPI  htn---son tries to help with meds by sorting weekly meds Pt takes meds intermittently. Probably less than 4 days weekly. No angioedema on benicar  Stroke---no known recurrence  Past Medical History  Diagnosis Date  . Hypertension   . Osteopenia   . Cerebrovascular disease   . Angioedema     2/2 ACE inhibitors  . Arthritis   . Stroke   . Mild memory disturbances not amounting to dementia   . Atrial tachycardia     a.  eval for syncope 3/12 with event monitor that demonstrated WCT felt to likely be ATach with aberrancy and Fleacainide started in 4/12;   b.  echo 05/08/10: EF 55-60%, mod LVH, trivial AS (AVA 1.4, mean gradient 24), trivial AI, mod MAC with mean gradient 10 mmHg, mild to mod LAE and mild RVH   Past Surgical History  Procedure Date  . Abdominal hysterectomy 1963    unilateral oophorectomy  . Bunionectomy 1986  . Dilation and curettage of uterus 1962    reports that she has quit smoking. She does not have any smokeless tobacco history on file. She reports that she drinks alcohol. Her drug history not on file. family history includes Heart disease in her father and mother.  There is no history of Colon cancer. Allergies  Allergen Reactions  . Ace Inhibitors     REACTION: angioedema  . Famotidine     REACTION: tongue swelling  . Felodipine     REACTION: ? angioedma after taking for 26 days---UNCLEAR  . Fexofenadine     REACTION: knees buckled  . Flecainide Nausea Only  . Lisinopril     REACTION: itching, rash  . Metoprolol Tartrate     REACTION: vertigo  . Penicillins     REACTION: rash  . Pneumococcal Vaccine Polyvalent     REACTION: unspecified     Review of Systems  patient denies chest pain, shortness of breath, orthopnea. Denies lower extremity edema, abdominal pain, change in appetite, change in bowel movements. Patient denies rashes,  musculoskeletal complaints. No other specific complaints in a complete review of systems.      Objective:   Physical Exam   Well-developed well-nourished female in no acute distress. HEENT exam atraumatic, normocephalic, extraocular muscles are intact. Neck is supple. No jugular venous distention no thyromegaly. Chest clear to auscultation without increased work of breathing. Cardiac exam S1 and S2 are regular, 3/6 sem. Abdominal exam active bowel sounds, soft, nontender. Extremities no edema.      Assessment & Plan:

## 2010-09-25 ENCOUNTER — Telehealth: Payer: Self-pay | Admitting: Internal Medicine

## 2010-09-25 NOTE — Telephone Encounter (Signed)
Pt was referred to ENT for her hearing problems, etc. She cannot get in until 9/11, and her son says things are "escalating" and would like to know if we can do anything to get her seen earlier. I told him it was probably not likely, as specialty depts can be hard to get into (I actually think 9/11 is pretty good). But he would appreciate a call.

## 2010-09-28 NOTE — Telephone Encounter (Signed)
Dr Cato Mulligan, do you want [place] to try to schedule for earlier or is this ok?

## 2010-09-28 NOTE — Telephone Encounter (Signed)
Son [name] [name] called again 8/27. Is requesting ENT appt this week.

## 2010-09-30 NOTE — Assessment & Plan Note (Signed)
No recurrence Currently on ARB

## 2010-09-30 NOTE — Assessment & Plan Note (Signed)
Discussed with patient and son and husband No known recurrence Continue risk factor modification She and family admit to noncompliance  Advised assisted living

## 2010-09-30 NOTE — Assessment & Plan Note (Signed)
BP Readings from Last 3 Encounters:  09/21/10 164/72  09/04/10 152/72  08/20/10 130/78  she has not taken meds for the past 3 days Advised absolute compliance Pt and family members understand

## 2010-11-10 ENCOUNTER — Ambulatory Visit (INDEPENDENT_AMBULATORY_CARE_PROVIDER_SITE_OTHER): Payer: Medicare Other

## 2010-11-10 DIAGNOSIS — Z23 Encounter for immunization: Secondary | ICD-10-CM

## 2010-11-19 ENCOUNTER — Ambulatory Visit: Payer: Medicare Other | Admitting: Internal Medicine

## 2010-11-20 ENCOUNTER — Encounter: Payer: Self-pay | Admitting: Internal Medicine

## 2010-11-20 ENCOUNTER — Other Ambulatory Visit: Payer: Self-pay | Admitting: *Deleted

## 2010-11-20 ENCOUNTER — Ambulatory Visit (INDEPENDENT_AMBULATORY_CARE_PROVIDER_SITE_OTHER): Payer: Medicare Other | Admitting: Internal Medicine

## 2010-11-20 DIAGNOSIS — I6350 Cerebral infarction due to unspecified occlusion or stenosis of unspecified cerebral artery: Secondary | ICD-10-CM

## 2010-11-20 DIAGNOSIS — I10 Essential (primary) hypertension: Secondary | ICD-10-CM

## 2010-11-20 MED ORDER — AMLODIPINE BESYLATE 5 MG PO TABS: 5.0000 mg | ORAL_TABLET | Freq: Every day | ORAL | Status: DC

## 2010-11-20 MED ORDER — OLMESARTAN MEDOXOMIL 20 MG PO TABS: 20.0000 mg | ORAL_TABLET | Freq: Every day | ORAL | Status: DC

## 2010-11-20 NOTE — Progress Notes (Signed)
  Subjective:    Patient ID: [name] [name], female    DOB: 1923-08-27, 77 y.o.   MRN: [id number]  HPI  77 year old patient who has a history of hypertension who is seen today for followup. She is accompanied by her son and will be traveling to [place] for an extended visit. She needs medication refills. Her husband passed away earlier this month. She does have a history of anxiety but has done quite well. She has not taken any anxiolytics over the past week. Clinically she is doing well   Review of Systems  Constitutional: Negative.   HENT: Negative for hearing loss, congestion, sore throat, rhinorrhea, dental problem, sinus pressure and tinnitus.   Eyes: Negative for pain, discharge and visual disturbance.  Respiratory: Negative for cough and shortness of breath.   Cardiovascular: Negative for chest pain, palpitations and leg swelling.  Gastrointestinal: Negative for nausea, vomiting, abdominal pain, diarrhea, constipation, blood in stool and abdominal distention.  Genitourinary: Negative for dysuria, urgency, frequency, hematuria, flank pain, vaginal bleeding, vaginal discharge, difficulty urinating, vaginal pain and pelvic pain.  Musculoskeletal: Negative for joint swelling, arthralgias and gait problem.  Skin: Negative for rash.  Neurological: Negative for dizziness, syncope, speech difficulty, weakness, numbness and headaches.  Hematological: Negative for adenopathy.  Psychiatric/Behavioral: Negative for behavioral problems, dysphoric mood and agitation. The patient is not nervous/anxious.        Objective:   Physical Exam  Constitutional: She is oriented to person, place, and time. She appears well-developed and well-nourished.       Blood pressure 120/80  HENT:  Head: Normocephalic.  Right Ear: External ear normal.  Left Ear: External ear normal.  Mouth/Throat: Oropharynx is clear and moist.  Eyes: Conjunctivae and EOM are normal. Pupils are equal, round, and reactive to  light.  Neck: Normal range of motion. Neck supple. No thyromegaly present.  Cardiovascular: Normal rate and intact distal pulses.   Murmur heard.      Grade 3/6 systolic murmur. Radiation to the carotid distribution right greater than the left Frequent ectopics  Pulmonary/Chest: Effort normal and breath sounds normal.  Abdominal: Soft. Bowel sounds are normal. She exhibits no mass. There is no tenderness.  Musculoskeletal: Normal range of motion.  Lymphadenopathy:    She has no cervical adenopathy.  Neurological: She is alert and oriented to person, place, and time.  Skin: Skin is warm and dry. No rash noted.  Psychiatric: She has a normal mood and affect. Her behavior is normal.          Assessment & Plan:   Hypertension. Well controlled Anxiety stable  Medications were refilled samples of Azor  dispensed

## 2010-11-20 NOTE — Patient Instructions (Signed)
Limit your sodium (Salt) intake  Please check your blood pressure on a regular basis.  If it is consistently greater than 150/90, please make an office appointment.   

## 2010-11-30 ENCOUNTER — Telehealth: Payer: Self-pay | Admitting: Internal Medicine

## 2010-11-30 DIAGNOSIS — R6889 Other general symptoms and signs: Secondary | ICD-10-CM

## 2010-11-30 NOTE — Telephone Encounter (Signed)
[facility]

## 2010-11-30 NOTE — Telephone Encounter (Signed)
Need recommendation of a geriatric who specializes in the elderly. Thanks.

## 2010-12-01 NOTE — Telephone Encounter (Signed)
Cindy please call bill at [phone]

## 2010-12-01 NOTE — Telephone Encounter (Signed)
Do you mean Dr Pete Glatter? [facility] is an [employer]

## 2010-12-01 NOTE — Telephone Encounter (Signed)
yes

## 2010-12-02 NOTE — Telephone Encounter (Signed)
Referral placed.

## 2010-12-13 ENCOUNTER — Encounter (HOSPITAL_COMMUNITY): Payer: Self-pay | Admitting: Emergency Medicine

## 2010-12-13 ENCOUNTER — Inpatient Hospital Stay (HOSPITAL_COMMUNITY)
Admission: EM | Admit: 2010-12-13 | Discharge: 2010-12-19 | DRG: 310 | Disposition: A | Discharge status: Home / Self Care | Payer: Medicare Other | Attending: Cardiology | Admitting: Cardiology

## 2010-12-13 ENCOUNTER — Emergency Department (HOSPITAL_COMMUNITY): Payer: Medicare Other

## 2010-12-13 ENCOUNTER — Other Ambulatory Visit: Payer: Self-pay

## 2010-12-13 DIAGNOSIS — I4719 Other supraventricular tachycardia: Secondary | ICD-10-CM | POA: Diagnosis present

## 2010-12-13 DIAGNOSIS — Z7901 Long term (current) use of anticoagulants: Secondary | ICD-10-CM

## 2010-12-13 DIAGNOSIS — R55 Syncope and collapse: Secondary | ICD-10-CM

## 2010-12-13 DIAGNOSIS — I471 Supraventricular tachycardia, unspecified: Secondary | ICD-10-CM | POA: Diagnosis present

## 2010-12-13 DIAGNOSIS — M129 Arthropathy, unspecified: Secondary | ICD-10-CM | POA: Diagnosis present

## 2010-12-13 DIAGNOSIS — I10 Essential (primary) hypertension: Secondary | ICD-10-CM | POA: Diagnosis present

## 2010-12-13 DIAGNOSIS — R42 Dizziness and giddiness: Secondary | ICD-10-CM

## 2010-12-13 DIAGNOSIS — R413 Other amnesia: Secondary | ICD-10-CM

## 2010-12-13 DIAGNOSIS — I6350 Cerebral infarction due to unspecified occlusion or stenosis of unspecified cerebral artery: Secondary | ICD-10-CM | POA: Diagnosis not present

## 2010-12-13 DIAGNOSIS — Z8673 Personal history of transient ischemic attack (TIA), and cerebral infarction without residual deficits: Secondary | ICD-10-CM

## 2010-12-13 DIAGNOSIS — Z79899 Other long term (current) drug therapy: Secondary | ICD-10-CM

## 2010-12-13 DIAGNOSIS — I6529 Occlusion and stenosis of unspecified carotid artery: Secondary | ICD-10-CM | POA: Diagnosis present

## 2010-12-13 DIAGNOSIS — F411 Generalized anxiety disorder: Secondary | ICD-10-CM | POA: Diagnosis present

## 2010-12-13 DIAGNOSIS — M949 Disorder of cartilage, unspecified: Secondary | ICD-10-CM | POA: Diagnosis present

## 2010-12-13 DIAGNOSIS — I4891 Unspecified atrial fibrillation: Principal | ICD-10-CM | POA: Diagnosis present

## 2010-12-13 DIAGNOSIS — F0390 Unspecified dementia without behavioral disturbance: Secondary | ICD-10-CM | POA: Diagnosis present

## 2010-12-13 DIAGNOSIS — M899 Disorder of bone, unspecified: Secondary | ICD-10-CM | POA: Diagnosis present

## 2010-12-13 LAB — COMPREHENSIVE METABOLIC PANEL
ALT: 13 U/L (ref 0–35)
AST: 16 U/L (ref 0–37)
Alkaline Phosphatase: 65 U/L (ref 39–117)
CO2: 27 mEq/L (ref 19–32)
GFR calc Af Amer: 63 mL/min — ABNORMAL LOW (ref >90)
GFR calc non Af Amer: 54 mL/min — ABNORMAL LOW (ref >90)
Glucose, Bld: 92 mg/dL (ref 70–99)
Potassium: 3.8 mEq/L (ref 3.5–5.1)
Sodium: 145 mEq/L (ref 135–145)
Total Protein: 6.3 g/dL (ref 6.0–8.3)

## 2010-12-13 LAB — RAPID URINE DRUG SCREEN, HOSP PERFORMED
Amphetamines: NOT DETECTED
Barbiturates: NOT DETECTED

## 2010-12-13 LAB — URINALYSIS, ROUTINE W REFLEX MICROSCOPIC
Glucose, UA: NEGATIVE mg/dL
Nitrite: NEGATIVE
Protein, ur: NEGATIVE mg/dL

## 2010-12-13 LAB — CBC
HCT: 41.7 % (ref 36.0–46.0)
Hemoglobin: 14.2 g/dL (ref 12.0–15.0)
MCHC: 34.1 g/dL (ref 30.0–36.0)
MCV: 87.4 fL (ref 78.0–100.0)
RDW: 14.6 % (ref 11.5–15.5)

## 2010-12-13 LAB — URINE MICROSCOPIC-ADD ON

## 2010-12-13 LAB — LACTIC ACID, PLASMA: Lactic Acid, Venous: 1.5 mmol/L (ref 0.5–2.2)

## 2010-12-13 LAB — CREATININE, SERUM: GFR calc Af Amer: 68 mL/min — ABNORMAL LOW (ref >90)

## 2010-12-13 MED ORDER — DILTIAZEM HCL 100 MG IV SOLR
5.0000 mg/h | INTRAVENOUS | Status: AC
  Administered 2010-12-13 – 2010-12-14 (×2): 5 mg/h via INTRAVENOUS
  Filled 2010-12-13: qty 100

## 2010-12-13 MED ORDER — ONE-DAILY MULTI VITAMINS PO TABS: 1.0000 | ORAL_TABLET | Freq: Every day | ORAL | Status: DC

## 2010-12-13 MED ORDER — OLMESARTAN MEDOXOMIL 20 MG PO TABS
20.0000 mg | ORAL_TABLET | Freq: Every day | ORAL | Status: DC
  Administered 2010-12-13 – 2010-12-19 (×7): 20 mg via ORAL
  Filled 2010-12-13 (×7): qty 1

## 2010-12-13 MED ORDER — DILTIAZEM HCL 25 MG/5ML IV SOLN
25.0000 mg | Freq: Once | INTRAVENOUS | Status: AC
  Administered 2010-12-13: 25 mg via INTRAVENOUS
  Filled 2010-12-13: qty 5

## 2010-12-13 MED ORDER — ONDANSETRON HCL 4 MG/2ML IJ SOLN: 4.0000 mg | Freq: Four times a day (QID) | INTRAMUSCULAR | Status: DC | PRN

## 2010-12-13 MED ORDER — ASPIRIN 81 MG PO TABS: 81.0000 mg | ORAL_TABLET | Freq: Every day | ORAL | Status: DC

## 2010-12-13 MED ORDER — LORAZEPAM 0.5 MG PO TABS
0.5000 mg | ORAL_TABLET | ORAL | Status: DC | PRN
  Administered 2010-12-13 – 2010-12-18 (×5): 0.5 mg via ORAL
  Filled 2010-12-13 (×6): qty 1

## 2010-12-13 MED ORDER — DILTIAZEM HCL 100 MG IV SOLR
INTRAVENOUS | Status: AC
  Filled 2010-12-13: qty 100

## 2010-12-13 MED ORDER — AMLODIPINE BESYLATE 5 MG PO TABS
5.0000 mg | ORAL_TABLET | Freq: Every day | ORAL | Status: DC
  Administered 2010-12-13: 5 mg via ORAL
  Filled 2010-12-13 (×2): qty 1

## 2010-12-13 MED ORDER — ONDANSETRON HCL 4 MG PO TABS: 4.0000 mg | ORAL_TABLET | Freq: Three times a day (TID) | ORAL | Status: DC | PRN

## 2010-12-13 MED ORDER — THERA M PLUS PO TABS
1.0000 | ORAL_TABLET | Freq: Every day | ORAL | Status: DC
  Administered 2010-12-13 – 2010-12-19 (×7): 1 via ORAL
  Filled 2010-12-13 (×7): qty 1

## 2010-12-13 MED ORDER — ASPIRIN EC 81 MG PO TBEC
81.0000 mg | DELAYED_RELEASE_TABLET | Freq: Every day | ORAL | Status: DC
  Administered 2010-12-13 – 2010-12-15 (×3): 81 mg via ORAL
  Filled 2010-12-13 (×4): qty 1

## 2010-12-13 MED ORDER — ENOXAPARIN SODIUM 30 MG/0.3ML ~~LOC~~ SOLN
30.0000 mg | SUBCUTANEOUS | Status: DC
  Filled 2010-12-13: qty 0.3

## 2010-12-13 MED ORDER — ACETAMINOPHEN 325 MG PO TABS: 650.0000 mg | ORAL_TABLET | ORAL | Status: DC | PRN

## 2010-12-13 MED ORDER — ENOXAPARIN SODIUM 40 MG/0.4ML ~~LOC~~ SOLN
40.0000 mg | SUBCUTANEOUS | Status: DC
  Administered 2010-12-13: 30 mg via SUBCUTANEOUS
  Administered 2010-12-14 – 2010-12-15 (×2): 40 mg via SUBCUTANEOUS
  Filled 2010-12-13 (×5): qty 0.4

## 2010-12-13 NOTE — ED Notes (Signed)
Report given to Alvino Chapel, RN in CDU and x-ray to transport pt back to CDU.

## 2010-12-13 NOTE — ED Notes (Signed)
Pt will be brought over to CDU from radiology

## 2010-12-13 NOTE — ED Notes (Signed)
Pt had witnessed seizure like activity about 15 mins ago. Pt does not have history of seizures. Pt c/o blurred vision. Pt denies headache, reports weakness.

## 2010-12-13 NOTE — ED Notes (Signed)
Pt is here and on monitor, family is at the bedside.  Pt is in no distress.  Afib 130's-150's.

## 2010-12-13 NOTE — ED Notes (Signed)
Vital signs stable. 

## 2010-12-13 NOTE — ED Notes (Signed)
Patient is resting comfortably. 

## 2010-12-13 NOTE — ED Notes (Signed)
Attempt to call report to [phone]

## 2010-12-13 NOTE — ED Notes (Signed)
Family at bedside. 

## 2010-12-13 NOTE — H&P (Signed)
Physician Discharge Summary  Patient ID: Nou F [place] MRN: [id number] DOB/AGE: [birth year] 77 y.o. Admit date: 12/13/2010  Primary Care Physician:SWORDS,BRUCE Sherilyn Cooter, MD, MD Primary Cardiologist    [facility] Principal Problem:  *Syncope Active Problems:  HYPERTENSION  STROKE  Atrial tachycardia, paroxysmal  Dizziness  Memory change  HPI: The patient has a history of arrhythmias. She was seen last in the office by Tereso Newcomer PA on Jun 15, 2010. She is followed by Dr. Shirlee Latch and she is also seen Dr. Graciela Husbands. She has had problems with decreased memory recently. She also has had significant dizziness. She cannot remember any of these issues. Her family is in the room and describes her situation. She's had dizziness when standing. However she's also had dizziness when trying to sit from a lying position in bed. She says the room does move. It sounds like she's having significant vertigo. Today she had continued dizziness this morning. She had just finished lunch today with her family when she seemed to lean backwards with possible several seconds of loss of consciousness. There was some rolling of the eyes and some tightening of the jaw. There was no tonic-clonic motion. She did not fall forward or backward and there was no definite complete syncope. This lasted for 10-15 seconds. She did stabilize and was brought to the emergency room. In the emergency room her heart rate was noted to be approximately 150. There was question in the past of possibly some type of reaction to a calcium blocker in the past. However we will felt it was safe to start Cardizem. She was given a bolus and her rate came down. She does not feel the rapid rhythm.  Review of systems;      Patient denies fever, chills, headache, sweats, rash, change in vision, change in hearing, chest pain, cough, nausea vomiting, urinary symptoms. All other systems are reviewed and are negative.    Past Medical History  Diagnosis Date  .  Hypertension   . Osteopenia   . Cerebrovascular disease   . Angioedema     2/2 ACE inhibitors  . Arthritis   . Stroke   . Mild memory disturbances not amounting to dementia   . Atrial tachycardia     a.  eval for syncope 3/12 with event monitor that demonstrated WCT felt to likely be ATach with aberrancy and Fleacainide started in 4/12;   b.  echo 05/08/10: EF 55-60%, mod LVH, trivial AS (AVA 1.4, mean gradient 24), trivial AI, mod MAC with mean gradient 10 mmHg, mild to mod LAE and mild RVH    Family History  Problem Relation Age of Onset  . Heart disease Mother   . Heart disease Father   . Colon cancer Neg Hx     History   Social History  . Marital Status: Married    Spouse Name: N/A    Number of Children: N/A  . Years of Education: N/A   Occupational History  . Retired    Social History Main Topics  . Smoking status: Former Games developer  . Smokeless tobacco: Not on file  . Alcohol Use: No  . Drug Use: No  . Sexually Active:    Other Topics Concern  . Not on file   Social History Narrative  . No narrative on file    Past Surgical History  Procedure Date  . Abdominal hysterectomy 1963    unilateral oophorectomy  . Bunionectomy 1986  . Dilation and curettage of uterus 1962      (  Not in a hospital admission)  Physical Exam:     Patient is oriented to person time and place but she really has difficulty remembering very recent things. Her family is in the room. Affect is normal at this time. Head is atraumatic. There is no xanthelasma. There is no jugular venous distention. There are no carotid bruits. Lungs are clear. Respiratory effort is nonlabored. Cardiac exam reveals a rapid rate. There is no significant murmur. The abdomen is soft. There is no significant peripheral edema. There are no current musculoskeletal deformities other than her kyphosis of the spine. There are no skin rashes.  Labs:   Lab Results  Component Value Date   WBC 11.2* 06/17/2010   HGB 14.9  06/17/2010   HCT 44.8 06/17/2010   MCV 90.0 06/17/2010   PLT 201.0 06/17/2010    Lab 12/13/10 1521  NA 145  K 3.8  CL 107  CO2 27  BUN 18  CREATININE 0.93  CALCIUM 9.1  PROT 6.3  BILITOT 0.3  ALKPHOS 65  ALT 13  AST 16  GLUCOSE 92   Lab Results  Component Value Date   CKTOTAL 31 05/01/2010   CKMB 0.9 05/01/2010   TROPONINI  Value: 0.02        NO INDICATION OF MYOCARDIAL INJURY. 05/01/2010    Lab Results  Component Value Date   CHOL  Value: 168        ATP III CLASSIFICATION:  <200     mg/dL   Desirable  960-454  mg/dL   Borderline High  >=098    mg/dL   High        02/20/1476   CHOL 163 04/02/2009   CHOL 177 05/25/2007   Lab Results  Component Value Date   HDL 49 05/01/2010   HDL 57.90 04/02/2009   HDL 57.8 05/25/2007   Lab Results  Component Value Date   LDLCALC  Value: 97        Total Cholesterol/HDL:CHD Risk Coronary Heart Disease Risk Table                     Men   Women  1/2 Average Risk   3.4   3.3  Average Risk       5.0   4.4  2 X Average Risk   9.6   7.1  3 X Average Risk  23.4   11.0        Use the calculated Patient Ratio above and the CHD Risk Table to determine the patient's CHD Risk.        ATP III CLASSIFICATION (LDL):  <100     mg/dL   Optimal  295-621  mg/dL   Near or Above                    Optimal  130-159  mg/dL   Borderline  308-657  mg/dL   High  >846     mg/dL   Very High 9/62/9528   LDLCALC 85 04/02/2009   LDLCALC 103* 05/25/2007   Lab Results  Component Value Date   TRIG 108 05/01/2010   TRIG 102.0 04/02/2009   TRIG 83 05/25/2007   Lab Results  Component Value Date   CHOLHDL 3.4 05/01/2010   CHOLHDL 3 04/02/2009   CHOLHDL 3.1 CALC 05/25/2007   No results found for this basename: LDLDIRECT      Radiology: Dg Chest 2 View  12/13/2010  *RADIOLOGY REPORT*  Clinical Data: New onset seizure.  Altered level of consciousness.  CHEST - 2 VIEW  Comparison: 04/30/2010  Findings: There is mild cardiomegaly with tortuosity of the thoracic aorta.  No acute infiltrates  or effusions.  Chronic peribronchial thickening.  No acute osseous abnormalities.  No effusions.  IMPRESSION: Mild cardiomegaly.  Chronic bronchitic changes.  Original Report Authenticated By: Gwynn Burly, M.D.   Ct Head Wo Contrast  12/13/2010  *RADIOLOGY REPORT*  Clinical Data: New onset seizure  CT HEAD WITHOUT CONTRAST  Technique:  Contiguous axial images were obtained from the base of the skull through the vertex without contrast.  Comparison: 05/05/2010  Findings: No evidence of parenchymal hemorrhage or extra-axial fluid collection. No mass lesion, mass effect, or midline shift.  No CT evidence of acute infarction.  Old right basal ganglia lacunar infarct extending into the corona radiata.  Stable mild hypodensity in the right internal capsule extending into the right cerebral peduncle/midbrain (series 2/image 11), possibly reflecting wallerian degeneration.  Small vessel ischemic changes.  Intracranial atherosclerosis.  Age related atrophy.  The visualized paranasal sinuses are essentially clear. The mastoid air cells are unopacified.  No evidence of calvarial fracture.  IMPRESSION: No evidence of acute intracranial abnormality.  Old right basal ganglia lacunar infarct with associated wallerian degeneration.  Atrophy with small vessel ischemic changes and intracranial atherosclerosis.  Original Report Authenticated By: Charline Bills, M.D.   EKG    EKG is done in the emergency room. It is reviewed by me. The rhythm appears to be irregularly irregular compatible with atrial fibrillation. The initial rate was 150 beats per minute. This is now slower on telemetry. There are diffuse nonspecific ST-T wave changes.  ASSESSMENT AND PLAN:   Principal Problem:  *Syncope        The etiology of the patient's spelled today is not clear. It lasted for only 10-15 seconds. It was clear to the patient's family that she appeared to lean backwards and rolled her eyes and get some stiffness in her jaw. She  does not describe palpitations. It's difficult to believe that the onset of atrial fibrillation would lead to this symptomatology. She did not lose control of her urine or bowels. She has been having significant dizziness. She did not describe being dizzy within a few moments before the episode. However clearly she's had a return of her atrial arrhythmia and this will have to be kept in mind. He had a head CT. There is some disease. We may need neurology input.   HYPERTENSION   STROKE       There is a history of a CVA in the past clinically but she has no significant residual from this.   Atrial tachycardia, paroxysmal        The patient had previously been admitted on May 01, 2010 with syncope. She had been taking diltiazem, nevbilol. And clonidine at that time. She had been wearing a Holter monitor. There was a wide complex arrhythmia and she was reported to  to the ER. Initially was thought to be atrial flutter with one-to-one conduction versus ventricular tachycardia. Echo revealed good LV function. She was seen by Dr. Graciela Husbands who felt that she had an atrial tachycardia with aberrancy and recommended starting flecainide. However on the flecainide she was dizzy with diplopia nausea and tremor. Therefore the flecainide was stopped in the office when seen by Mr. Alben Spittle on Jun 15, 2010. At that time there was plan for carotid Doppler. However according to the patient's daughter she is very stubborn and decided she  did not want to have this. She is now here with atrial fibrillation. We will admit her and watch her overnight on Cardizem for rate control. We will then get more input from Dr. Graciela Husbands and Dr. Shirlee Latch. I am not convinced that this patient would be a good Coumadin candidate. Therefore I have chosen not to fully heparinize her on admission into the hospital until we have a better feel for her overall status. I'm using DVT prophylactic dosing of Lovenox.   Dizziness      The patient is having  significant dizziness. I cannot be sure if this is related to her presenting problem or not. She may well need neurology evaluation.   Memory change      This appears to be a significant problem. It will certainly player rolled the overall long-term approach to her care. Her family is very attentive.   Signed: Willa Rough 12/13/2010, 5:17 PM Co-Sign MD

## 2010-12-13 NOTE — ED Notes (Signed)
Pt is in no distress, IV started.  Await further orders

## 2010-12-13 NOTE — ED Notes (Signed)
Cardiology is at the bedside.  No distress at this time

## 2010-12-13 NOTE — ED Notes (Signed)
Pt meds verified.   Pt also takes krill oil 500mg  daily Vitamin D 1000IU daily and Carnasene 500mg  daily

## 2010-12-13 NOTE — ED Provider Notes (Signed)
History     CSN: [id number] Arrival date & time: 12/13/2010 12:30 PM   First MD Initiated Contact with Patient 12/13/10 1506      Chief Complaint  Patient presents with  . Seizures    (Consider location/radiation/quality/duration/timing/severity/associated sxs/prior treatment) Patient is a 77 y.o. female presenting with syncope. The history is provided by the patient and a relative.  Loss of Consciousness This is a new problem. The current episode started less than 1 hour ago. The problem has been resolved. Pertinent negatives include no chest pain and no shortness of breath. The symptoms are aggravated by nothing. She has tried nothing for the symptoms.    Past Medical History  Diagnosis Date  . Hypertension   . Osteopenia   . Cerebrovascular disease   . Angioedema     2/2 ACE inhibitors  . Arthritis   . Stroke   . Mild memory disturbances not amounting to dementia   . Atrial tachycardia     a.  eval for syncope 3/12 with event monitor that demonstrated WCT felt to likely be ATach with aberrancy and Fleacainide started in 4/12;   b.  echo 05/08/10: EF 55-60%, mod LVH, trivial AS (AVA 1.4, mean gradient 24), trivial AI, mod MAC with mean gradient 10 mmHg, mild to mod LAE and mild RVH    Past Surgical History  Procedure Date  . Abdominal hysterectomy 1963    unilateral oophorectomy  . Bunionectomy 1986  . Dilation and curettage of uterus 1962    Family History  Problem Relation Age of Onset  . Heart disease Mother   . Heart disease Father   . Colon cancer Neg Hx     History  Substance Use Topics  . Smoking status: Former Games developer  . Smokeless tobacco: Not on file  . Alcohol Use: No    OB History    Grav Para Term Preterm Abortions TAB SAB Ect Mult Living                  Review of Systems  Constitutional: Negative for fever.  HENT: Negative.   Respiratory: Negative for shortness of breath.   Cardiovascular: Positive for syncope. Negative for chest pain.    Gastrointestinal: Negative.   Genitourinary: Negative.   Musculoskeletal: Negative.   Skin: Negative.   Neurological: Negative for numbness.  Hematological: Negative.   All other systems reviewed and are negative.    Allergies  Ace inhibitors; Famotidine; Felodipine; Fexofenadine; Flecainide; Lisinopril; Metoprolol tartrate; Penicillins; and Pneumococcal vaccine polyvalent  Home Medications   Current Outpatient Rx  Name Route Sig Dispense Refill  . AMLODIPINE BESYLATE 5 MG PO TABS Oral Take 1 tablet (5 mg total) by mouth daily. 30 tablet 5  . VITAMIN C 500 MG PO TABS Oral Take 500 mg by mouth daily.      . ASPIRIN 81 MG PO TABS Oral Take 81 mg by mouth daily.      Marland Kitchen LORAZEPAM 0.5 MG PO TABS Oral Take 0.5 mg by mouth as needed. For anxiety    . ONE-DAILY MULTI VITAMINS PO TABS Oral Take 1 tablet by mouth daily.      Marland Kitchen OLMESARTAN MEDOXOMIL 20 MG PO TABS Oral Take 1 tablet (20 mg total) by mouth daily. 90 tablet 6  . ONDANSETRON HCL 4 MG PO TABS Oral Take 4 mg by mouth every 8 (eight) hours as needed. For nausea     . TRIAMCINOLONE ACETONIDE 0.1 % EX CREA Topical Apply topically 2 (two) times  daily. 30 g 5    BP 133/74  Pulse 115  Temp(Src) 97.4 F (36.3 C) (Oral)  Resp 16  SpO2 97%  Physical Exam  Nursing note and vitals reviewed. Constitutional: She is oriented to person, place, and time. She appears well-developed and well-nourished. No distress.  HENT:  Head: Normocephalic and atraumatic.  Eyes: Pupils are equal, round, and reactive to light.  Neck: Normal range of motion.  Cardiovascular: Intact distal pulses.  An irregularly irregular rhythm present.  Murmur heard.        Date: 12/13/2010  Rate: 150  Rhythm: Atrial fibrillation with RVR  QRS Axis: normal  Intervals: normal  ST/T Wave abnormalities: nonspecific ST/T changes  Conduction Disutrbances:none  Narrative Interpretation:   Old EKG Reviewed: yes      Pulmonary/Chest: No respiratory distress.   Abdominal: Normal appearance. She exhibits no distension.  Musculoskeletal: Normal range of motion.  Neurological: She is alert and oriented to person, place, and time. No cranial nerve deficit.  Skin: Skin is warm and dry. No rash noted.  Psychiatric: She has a normal mood and affect. Her behavior is normal.    ED Course  Procedures (including critical care time) CRITICAL CARE Performed by: Nelva Nay L   Total critical care time: 30  Critical care time was exclusive of separately billable procedures and treating other patients.  Critical care was necessary to treat or prevent imminent or life-threatening deterioration.  Critical care was time spent personally by me on the following activities: development of treatment plan with patient and/or surrogate as well as nursing, discussions with consultants, evaluation of patient's response to treatment, examination of patient, obtaining history from patient or surrogate, ordering and performing treatments and interventions, ordering and review of laboratory studies, ordering and review of radiographic studies, pulse oximetry and re-evaluation of patient's condition.  Labs Reviewed  COMPREHENSIVE METABOLIC PANEL - Abnormal; Notable for the following:    Albumin 3.3 (*)    GFR calc non Af Amer 54 (*)    GFR calc Af Amer 63 (*)    All other components within normal limits  LACTIC ACID, PLASMA  POCT I-STAT TROPONIN I  I-STAT TROPONIN I  URINE RAPID DRUG SCREEN (HOSP PERFORMED)  URINALYSIS, ROUTINE W REFLEX MICROSCOPIC   Dg Chest 2 View  12/13/2010  *RADIOLOGY REPORT*  Clinical Data: New onset seizure. Altered level of consciousness.  CHEST - 2 VIEW  Comparison: 04/30/2010  Findings: There is mild cardiomegaly with tortuosity of the thoracic aorta.  No acute infiltrates or effusions.  Chronic peribronchial thickening.  No acute osseous abnormalities.  No effusions.  IMPRESSION: Mild cardiomegaly.  Chronic bronchitic changes.  Original  Report Authenticated By: Gwynn Burly, M.D.   Ct Head Wo Contrast  12/13/2010  *RADIOLOGY REPORT*  Clinical Data: New onset seizure  CT HEAD WITHOUT CONTRAST  Technique:  Contiguous axial images were obtained from the base of the skull through the vertex without contrast.  Comparison: 05/05/2010  Findings: No evidence of parenchymal hemorrhage or extra-axial fluid collection. No mass lesion, mass effect, or midline shift.  No CT evidence of acute infarction.  Old right basal ganglia lacunar infarct extending into the corona radiata.  Stable mild hypodensity in the right internal capsule extending into the right cerebral peduncle/midbrain (series 2/image 11), possibly reflecting wallerian degeneration.  Small vessel ischemic changes.  Intracranial atherosclerosis.  Age related atrophy.  The visualized paranasal sinuses are essentially clear. The mastoid air cells are unopacified.  No evidence of calvarial fracture.  IMPRESSION: No evidence of acute intracranial abnormality.  Old right basal ganglia lacunar infarct with associated wallerian degeneration.  Atrophy with small vessel ischemic changes and intracranial atherosclerosis.  Original Report Authenticated By: Charline Bills, M.D.     1. Syncope   2. Atrial fibrillation with RVR       MDM        Patient given a diltiazem load for control of heart rate.  I spoke with Dr. Myrtis Ser who will come down and see the patient.  We discussed the adverse reaction to calcium channel blockers and decided to give her a bolus as previously ordered.  Nelia Shi, MD 12/14/10 [phone]

## 2010-12-13 NOTE — ED Notes (Signed)
Pt was given a heart healthy meal tray.  Notified her and family that room is available.  2000 unable to take report and will call back.  Pt is not in any distress

## 2010-12-13 NOTE — ED Notes (Signed)
Report given to 2000

## 2010-12-14 ENCOUNTER — Other Ambulatory Visit: Payer: Self-pay

## 2010-12-14 DIAGNOSIS — I517 Cardiomegaly: Secondary | ICD-10-CM

## 2010-12-14 DIAGNOSIS — I4891 Unspecified atrial fibrillation: Principal | ICD-10-CM | POA: Diagnosis present

## 2010-12-14 DIAGNOSIS — R55 Syncope and collapse: Secondary | ICD-10-CM

## 2010-12-14 LAB — PROTIME-INR
INR: 1.05 (ref 0.00–1.49)
Prothrombin Time: 13.9 seconds (ref 11.6–15.2)

## 2010-12-14 LAB — MAGNESIUM: Magnesium: 2.2 mg/dL (ref 1.5–2.5)

## 2010-12-14 MED ORDER — PATIENT'S GUIDE TO USING COUMADIN BOOK
Freq: Once | Status: AC
  Administered 2010-12-14: 11:00:00
  Filled 2010-12-14: qty 1

## 2010-12-14 MED ORDER — WARFARIN SODIUM 4 MG PO TABS
4.0000 mg | ORAL_TABLET | Freq: Once | ORAL | Status: AC
Start: 2010-12-14 — End: 2010-12-15
  Filled 2010-12-14: qty 1

## 2010-12-14 MED ORDER — DILTIAZEM HCL ER COATED BEADS 180 MG PO CP24
180.0000 mg | ORAL_CAPSULE | Freq: Every day | ORAL | Status: DC
  Administered 2010-12-14: 180 mg via ORAL
  Filled 2010-12-14 (×2): qty 1

## 2010-12-14 MED ORDER — WARFARIN VIDEO
Freq: Once | Status: AC
  Administered 2010-12-14: 11:00:00
  Filled 2010-12-14: qty 1

## 2010-12-14 NOTE — Progress Notes (Addendum)
Patient Name: [name] [name] Date of Encounter: @TODAY @     Principal Problem:  *Atrial fibrillation Active Problems:  HYPERTENSION  STROKE  Atrial tachycardia, paroxysmal  Dizziness  Memory change  Syncope    SUBJECTIVE: Pt remains in a.fib but is reasonably rate controlled (90's-110) and now asymptomatic.       Meds   amLODipine 5 mg, PO, Daily Given: 11/11 2130    aspirin EC tablet 81 mg, PO, Daily Given: 11/11 2131    diltiazem No Dose/Rate,  Ordered    enoxaparin (LOVENOX) injection 30 mg, [place], Q24H Given: 11/11 2132    multivitamins therapeutic w/minerals tablet 1 tablet, PO, Daily Given: 11/11 2100    olmesartan 20 mg, PO, Daily Given: 11/11 2135        Continuous         Medication Dose/Rate,Route,Frequency Last Action      diltiazem (CARDIZEM) infusion 5 mg/hr, IV, Continuous New Bag: 11/12 0919      OBJECTIVE  Filed Vitals:   12/13/10 1840 12/13/10 1900 12/13/10 1930 12/14/10 0529  BP: 129/91 138/62 138/62 115/55  Pulse:  105 105 95  Temp: 98.7 F (37.1 C) 97.4 F (36.3 C) 97.4 F (36.3 C) 97.2 F (36.2 C)  TempSrc: Oral Oral Oral Oral  Resp:  17 17 18   Weight:  122 lb 11.2 oz (55.656 kg)    SpO2: 99% 94% 94% 94%    Intake/Output Summary (Last 24 hours) at 12/14/10 0950 Last data filed at 12/14/10 0720  Gross per 24 hour  Intake      0 ml  Output   1026 ml  Net  -1026 ml   Weight change:   PHYSICAL EXAM  General: Well developed, well nourished, in no acute distress. Head: Normocephalic, atraumatic, sclera non-icteric, no xanthomas, nares are without discharge.  Neck: Supple w/o JVD.  + right bruit Lungs:  Resp regular and unlabored, CTA. Heart: IRIR no s3, s4, or murmurs.  Soft syst murmur @ apex. Abdomen: Soft, non-tender, non-distended, BS + x 4.  Msk:  Strength and tone appears normal for age. Extremities: No clubbing, cyanosis or edema. DP/PT/Radials 2+ and equal bilaterally. Neuro: Alert and oriented X 3. Moves all extremities  spontaneously. Psych: Normal affect.  LABS:  CBC:  Basename 12/13/10 2011  WBC 10.9*  NEUTROABS --  HGB 14.2  HCT 41.7  MCV 87.4  PLT 202   Basic Metabolic Panel:  Basename 12/13/10 2011 12/13/10 1521  NA -- 145  K -- 3.8  CL -- 107  CO2 -- 27  GLUCOSE -- 92  BUN -- 18  CREATININE 0.87 0.93  CALCIUM -- 9.1  MG -- --  PHOS -- --   Liver Function Tests:  Basename 12/13/10 1521  AST 16  ALT 13  ALKPHOS 65  BILITOT 0.3  PROT 6.3  ALBUMIN 3.3*    TELE: a. Fib 90's-1teens.  ECG - 82, a.fib, no acute changes   Radiology/Studies:  Dg Chest 2 View  12/13/2010  *RADIOLOGY REPORT*  Clinical Data: New onset seizure. Altered level of consciousness.  CHEST - 2 VIEW  Comparison: 04/30/2010  Findings: There is mild cardiomegaly with tortuosity of the thoracic aorta.  No acute infiltrates or effusions.  Chronic peribronchial thickening.  No acute osseous abnormalities.  No effusions.  IMPRESSION: Mild cardiomegaly.  Chronic bronchitic changes.  Original Report Authenticated By: Gwynn Burly, M.D.   Ct Head Wo Contrast  12/13/2010  *RADIOLOGY REPORT*  Clinical Data: New onset seizure  CT HEAD WITHOUT CONTRAST  Technique:  Contiguous axial images were obtained from the base of the skull through the vertex without contrast.  Comparison: 05/05/2010  Findings: No evidence of parenchymal hemorrhage or extra-axial fluid collection. No mass lesion, mass effect, or midline shift.  No CT evidence of acute infarction.  Old right basal ganglia lacunar infarct extending into the corona radiata.  Stable mild hypodensity in the right internal capsule extending into the right cerebral peduncle/midbrain (series 2/image 11), possibly reflecting wallerian degeneration.  Small vessel ischemic changes.  Intracranial atherosclerosis.  Age related atrophy.  The visualized paranasal sinuses are essentially clear. The mastoid air cells are unopacified.  No evidence of calvarial fracture.  IMPRESSION:  No evidence of acute intracranial abnormality.  Old right basal ganglia lacunar infarct with associated wallerian degeneration.  Atrophy with small vessel ischemic changes and intracranial atherosclerosis.  Original Report Authenticated By: Charline Bills, M.D.      ASSESSMENT AND PLAN:  1.  Afib RVR:  Now relatively rate controlled and asymp on dilt @ 5mg /hr IV.  ? Duration though son reports pt had HR's in 80's by home bp machine 3 days ago.  CHADS2 + = 4.  She reports a good activity level @ home and has previously been steady on her feet without falls.  There is no prior h/o bleeding and she is compliant with meds.  Will add coumadin per pharmacy.  Will also convert Dilt to 180 po qd.  Obtain 2d echo to re-eval EF.  Check TSH and Mg.  2.  HTN:  Stable.  D/C norvasc since we are adding PO dilt.  Follow and titrate Dilt as tolerated     Signed, Nicolasa Ducking NP  Patient seen with NP, agree with note.  Patient is staying in atrial fibrillation, rate controlled on diltiazem gtt.  She became bradycardic in the past on the combination of beta blocker, diltiazem, and clonidine.  Will switch to po diltiazem and follow her for 48 hours to assess for any bradyarrhythmias.  Per her son, she has been very steady on her feet.  Will start coumadin.  She will get an echo.   Adric Wrede [employer]

## 2010-12-14 NOTE — Progress Notes (Addendum)
ANTICOAGULATION CONSULT NOTE - Initial Consult  Pharmacy Consult for Coumadin  Indication: atrial fibrillation  Allergies  Allergen Reactions  . Ace Inhibitors     REACTION: angioedema  . Famotidine     REACTION: tongue swelling  . Felodipine     REACTION: ? angioedma after taking for 26 days---UNCLEAR  . Fexofenadine     REACTION: knees buckled  . Flecainide Nausea Only  . Lisinopril     REACTION: itching, rash  . Metoprolol Tartrate     REACTION: vertigo  . Penicillins     REACTION: rash  . Pneumococcal Vaccine Polyvalent     REACTION: unspecified    Patient Measurements: Weight: 122 lb 11.2 oz (55.656 kg)  Vital Signs: Temp: 97.2 F (36.2 C) (11/12 0529) Temp src: Oral (11/12 0529) BP: 115/55 mmHg (11/12 0529) Pulse Rate: 95  (11/12 0529)  Labs:  Basename 12/13/10 2011 12/13/10 1521  HGB 14.2 --  HCT 41.7 --  PLT 202 --  APTT -- --  LABPROT -- --  INR -- --  HEPARINUNFRC -- --  CREATININE 0.87 0.93  CKTOTAL -- --  CKMB -- --  TROPONINI -- --   The CrCl is unknown because both a height and weight (above a minimum accepted value) are required for this calculation.  Medical History: Past Medical History  Diagnosis Date  . Hypertension   . Osteopenia   . Cerebrovascular disease   . Angioedema     2/2 ACE inhibitors  . Arthritis   . Stroke   . Mild memory disturbances not amounting to dementia   . Atrial tachycardia     a.  eval for syncope 3/12 with event monitor that demonstrated WCT felt to likely be ATach with aberrancy and Fleacainide started in 4/12;   b.  echo 05/08/10: EF 55-60%, mod LVH, trivial AS (AVA 1.4, mean gradient 24), trivial AI, mod MAC with mean gradient 10 mmHg, mild to mod LAE and mild RVH    Medications:  Scheduled:    . aspirin EC  81 mg Oral Daily  . diltiazem  180 mg Oral Daily  . diltiazem      . diltiazem  25 mg Intravenous Once  . enoxaparin (LOVENOX) injection  40 mg Subcutaneous Q24H  . multivitamins ther.  w/minerals  1 tablet Oral Daily  . olmesartan  20 mg Oral Daily  . DISCONTD: amLODipine  5 mg Oral Daily  . DISCONTD: aspirin  81 mg Oral Daily  . DISCONTD: enoxaparin  30 mg Subcutaneous Q24H  . DISCONTD: multivitamin  1 tablet Oral Daily    Assessment: 87 YOF with afib, orders to start wafarin. On Lovenox 40mg  SQ q24h.  No acute CVA, but has h/o CVA. CHADS2= 4.    Goal of Therapy:  INR 2-3   Plan:  1. Start Coumadin 4mg  tonight, baseline INR = 1.05 2. Daily INR 3. Coumadin education materials   Dannielle Huh 12/14/2010,10:51 AM

## 2010-12-14 NOTE — Progress Notes (Signed)
*  PRELIMINARY RESULTS*   Carotid Dopplers completed.  No ICA stenosis.  Vertebral artery flow is antegrade.  Sherren Kerns Renee 12/14/2010, 12:54 PM

## 2010-12-14 NOTE — Progress Notes (Signed)
  Echocardiogram 2D Echocardiogram has been performed.  Juanita Laster Yaire Kreher, RDCS 12/14/2010, 12:28 PM

## 2010-12-15 LAB — CBC
Hemoglobin: 15.2 g/dL — ABNORMAL HIGH (ref 12.0–15.0)
Hemoglobin: 16.2 g/dL — ABNORMAL HIGH (ref 12.0–15.0)
MCH: 28.8 pg (ref 26.0–34.0)
MCH: 29.7 pg (ref 26.0–34.0)
MCV: 87.3 fL (ref 78.0–100.0)
RBC: 5.28 MIL/uL — ABNORMAL HIGH (ref 3.87–5.11)
RBC: 5.45 MIL/uL — ABNORMAL HIGH (ref 3.87–5.11)
RDW: 14.6 % (ref 11.5–15.5)

## 2010-12-15 LAB — BASIC METABOLIC PANEL
CO2: 23 mEq/L (ref 19–32)
Calcium: 9.4 mg/dL (ref 8.4–10.5)
Glucose, Bld: 120 mg/dL — ABNORMAL HIGH (ref 70–99)
Potassium: 4 mEq/L (ref 3.5–5.1)
Sodium: 140 mEq/L (ref 135–145)

## 2010-12-15 MED ORDER — DILTIAZEM HCL ER COATED BEADS 360 MG PO CP24
360.0000 mg | ORAL_CAPSULE | Freq: Every day | ORAL | Status: DC
  Administered 2010-12-15 – 2010-12-17 (×3): 360 mg via ORAL
  Filled 2010-12-15 (×3): qty 1

## 2010-12-15 MED ORDER — WARFARIN SODIUM 4 MG PO TABS
4.0000 mg | ORAL_TABLET | Freq: Once | ORAL | Status: AC
  Administered 2010-12-15: 4 mg via ORAL
  Filled 2010-12-15: qty 1

## 2010-12-15 NOTE — Progress Notes (Signed)
[facility]  SUBJECTIVE:No complaints this am but HR too high.  Telemetry: HR 90s-110s   Filed Vitals:   12/14/10 1259 12/14/10 1411 12/14/10 2100 12/15/10 0637  BP: 140/68 135/64 150/87 135/73  Pulse:  97 76 121  Temp:  98.1 F (36.7 C) 97.3 F (36.3 C) 97.1 F (36.2 C)  TempSrc:  Oral Oral Oral  Resp:  20 20 18   Height:      Weight:      SpO2:  96% 94% 93%    Intake/Output Summary (Last 24 hours) at 12/15/10 0710 Last data filed at 12/14/10 1822  Gross per 24 hour  Intake      0 ml  Output   1901 ml  Net  -1901 ml    LABS: Basic Metabolic Panel:  Basename 12/14/10 1223 12/13/10 2011 12/13/10 1521  NA -- -- 145  K -- -- 3.8  CL -- -- 107  CO2 -- -- 27  GLUCOSE -- -- 92  BUN -- -- 18  CREATININE -- 0.87 0.93  CALCIUM -- -- 9.1  MG 2.2 -- --  PHOS -- -- --   Liver Function Tests:  Basename 12/13/10 1521  AST 16  ALT 13  ALKPHOS 65  BILITOT 0.3  PROT 6.3  ALBUMIN 3.3*   No results found for this basename: LIPASE:2,AMYLASE:2 in the last 72 hours CBC:  Basename 12/13/10 2011  WBC 10.9*  NEUTROABS --  HGB 14.2  HCT 41.7  MCV 87.4  PLT 202   Thyroid Function Tests:  Basename 12/14/10 1223  TSH 1.207  T4TOTAL --  T3FREE --  THYROIDAB --   RADIOLOGY: Dg Chest 2 View  12/13/2010  *RADIOLOGY REPORT*  Clinical Data: New onset seizure. Altered level of consciousness.  CHEST - 2 VIEW  Comparison: 04/30/2010  Findings: There is mild cardiomegaly with tortuosity of the thoracic aorta.  No acute infiltrates or effusions.  Chronic peribronchial thickening.  No acute osseous abnormalities.  No effusions.  IMPRESSION: Mild cardiomegaly.  Chronic bronchitic changes.  Original Report Authenticated By: Gwynn Burly, M.D.   Ct Head Wo Contrast  12/13/2010  *RADIOLOGY REPORT*  Clinical Data: New onset seizure  CT HEAD WITHOUT CONTRAST  Technique:  Contiguous axial images were obtained from the base of the skull through the vertex without contrast.   Comparison: 05/05/2010  Findings: No evidence of parenchymal hemorrhage or extra-axial fluid collection. No mass lesion, mass effect, or midline shift.  No CT evidence of acute infarction.  Old right basal ganglia lacunar infarct extending into the corona radiata.  Stable mild hypodensity in the right internal capsule extending into the right cerebral peduncle/midbrain (series 2/image 11), possibly reflecting wallerian degeneration.  Small vessel ischemic changes.  Intracranial atherosclerosis.  Age related atrophy.  The visualized paranasal sinuses are essentially clear. The mastoid air cells are unopacified.  No evidence of calvarial fracture.  IMPRESSION: No evidence of acute intracranial abnormality.  Old right basal ganglia lacunar infarct with associated wallerian degeneration.  Atrophy with small vessel ischemic changes and intracranial atherosclerosis.  Original Report Authenticated By: Charline Bills, M.D.    PHYSICAL EXAM General: NAD Neck: No JVD, no thyromegaly or thyroid nodule.  Lungs: Clear to auscultation bilaterally with normal respiratory effort. CV: Nondisplaced PMI.  Heart mildly tachycardic, irregular S1/S2, no S3/S4, no murmur.  No peripheral edema.  No carotid bruit.  Normal pedal pulses.  Abdomen: Soft, nontender, no hepatosplenomegaly, no distention.  Neurologic: Alert and oriented x 3.  Psych: Normal affect. Extremities: No  clubbing or cyanosis.   TELEMETRY: Reviewed telemetry pt in atrial fibrillation in 90s-110s:  ASSESSMENT AND PLAN:  77 yo with history of paroxysmal atrial fibrillation admitted with atrial fibrillation and rapid response. She became bradycardic with nodal blockade in the past.  HR is still too high on diltiazem CD 180 mg daily.  She has BP room. - Increase diltiazem CD to 360 mg daily.  - Continue coumadin - Follow HR, if not controlled would consider cardioversion tomorrow.  - ambulate.  Keiondra Brookover [employer]

## 2010-12-15 NOTE — Progress Notes (Signed)
Utilization review completed. Addisson Frate, RN, BSN. 12/15/10 

## 2010-12-15 NOTE — Progress Notes (Signed)
ANTICOAGULATION CONSULT NOTE - Follow Up Consult  Pharmacy Consult for coumadin Indication: atrial fibrillation  Allergies  Allergen Reactions  . Ace Inhibitors     REACTION: angioedema  . Famotidine     REACTION: tongue swelling  . Felodipine     REACTION: ? angioedma after taking for 26 days---UNCLEAR  . Fexofenadine     REACTION: knees buckled  . Flecainide Nausea Only  . Lisinopril     REACTION: itching, rash  . Metoprolol Tartrate     REACTION: vertigo  . Penicillins     REACTION: rash  . Pneumococcal Vaccine Polyvalent     REACTION: unspecified    Patient Measurements: Height: 5\' 4"  (162.6 cm) Weight: 122 lb 11.2 oz (55.656 kg) IBW/kg (Calculated) : 54.7    Vital Signs: Temp: 97.7 F (36.5 C) (11/13 1320) Temp src: Oral (11/13 1320) BP: 120/73 mmHg (11/13 1320) Pulse Rate: 118  (11/13 1320)  Labs:  Basename 12/15/10 0930 12/15/10 0730 12/14/10 1223 12/13/10 2011 12/13/10 1521  HGB 16.2* 15.2* -- -- --  HCT 47.6* 46.2* -- 41.7 --  PLT 208 197 -- 202 --  APTT -- -- -- -- --  LABPROT -- 13.8 13.9 -- --  INR -- 1.04 1.05 -- --  HEPARINUNFRC -- -- -- -- --  CREATININE 0.91 -- -- 0.87 0.93  CKTOTAL -- -- -- -- --  CKMB -- -- -- -- --  TROPONINI -- -- -- -- --   Estimated Creatinine Clearance: 39 ml/min (by C-G formula based on Cr of 0.91).   Medications:  Scheduled:    . aspirin EC  81 mg Oral Daily  . diltiazem  360 mg Oral Daily  . enoxaparin (LOVENOX) injection  40 mg Subcutaneous Q24H  . multivitamins ther. w/minerals  1 tablet Oral Daily  . olmesartan  20 mg Oral Daily  . warfarin  4 mg Oral ONCE-1800  . DISCONTD: diltiazem  180 mg Oral Daily    Assessment: 87 yof started warfarin for afib and CHADS2=4.  I was notified today that warfarin was not given as it was not available at time it was to be administered.  Patient also ordered DVT prophylaxis dose of lovenox.   Goal of Therapy:  INR 2-3   Plan:  1. Coumadin 4mg  po x 1 tonight 2.  INR in am  Dannielle Huh 12/15/2010,2:37 PM

## 2010-12-16 ENCOUNTER — Other Ambulatory Visit: Payer: Self-pay

## 2010-12-16 ENCOUNTER — Encounter (HOSPITAL_COMMUNITY): Payer: Self-pay | Admitting: Cardiology

## 2010-12-16 LAB — CBC
MCV: 87.9 fL (ref 78.0–100.0)
Platelets: 201 10*3/uL (ref 150–400)
RBC: 5.11 MIL/uL (ref 3.87–5.11)
WBC: 10.2 10*3/uL (ref 4.0–10.5)

## 2010-12-16 LAB — BASIC METABOLIC PANEL
CO2: 29 mEq/L (ref 19–32)
Calcium: 9.2 mg/dL (ref 8.4–10.5)
GFR calc non Af Amer: 52 mL/min — ABNORMAL LOW (ref >90)
Sodium: 139 mEq/L (ref 135–145)

## 2010-12-16 MED ORDER — HYDROCORTISONE 1 % EX CREA
1.0000 "application " | TOPICAL_CREAM | Freq: Three times a day (TID) | CUTANEOUS | Status: DC | PRN
  Filled 2010-12-16: qty 28

## 2010-12-16 MED ORDER — SODIUM CHLORIDE 0.9 % IJ SOLN: 3.0000 mL | INTRAMUSCULAR | Status: DC | PRN

## 2010-12-16 MED ORDER — SODIUM CHLORIDE 0.9 % IJ SOLN: 3.0000 mL | Freq: Two times a day (BID) | INTRAMUSCULAR | Status: DC

## 2010-12-16 MED ORDER — WARFARIN SODIUM 4 MG PO TABS
4.0000 mg | ORAL_TABLET | Freq: Once | ORAL | Status: AC
  Administered 2010-12-16: 4 mg via ORAL
  Filled 2010-12-16: qty 1

## 2010-12-16 MED ORDER — SODIUM CHLORIDE 0.9 % IJ SOLN
3.0000 mL | Freq: Two times a day (BID) | INTRAMUSCULAR | Status: DC
  Administered 2010-12-17: 3 mL via INTRAVENOUS

## 2010-12-16 MED ORDER — SODIUM CHLORIDE 0.9 % IV SOLN: 250.0000 mL | INTRAVENOUS | Status: DC

## 2010-12-16 MED ORDER — MIDAZOLAM HCL 10 MG/2ML IJ SOLN
INTRAMUSCULAR | Status: AC
  Filled 2010-12-16: qty 2

## 2010-12-16 MED ORDER — HEPARIN (PORCINE) IN NACL 100-0.45 UNIT/ML-% IJ SOLN
900.0000 [IU]/h | INTRAMUSCULAR | Status: DC
  Administered 2010-12-16 – 2010-12-17 (×2): 900 [IU]/h via INTRAVENOUS
  Filled 2010-12-16 (×5): qty 250

## 2010-12-16 MED ORDER — HEPARIN BOLUS VIA INFUSION
2500.0000 [IU] | Freq: Once | INTRAVENOUS | Status: AC
  Administered 2010-12-16: 2500 [IU] via INTRAVENOUS
  Filled 2010-12-16: qty 2500

## 2010-12-16 MED ORDER — SODIUM CHLORIDE 0.9 % IV SOLN
250.0000 mL | INTRAVENOUS | Status: DC
  Administered 2010-12-16: 250 mL via INTRAVENOUS

## 2010-12-16 MED ORDER — FENTANYL CITRATE 0.05 MG/ML IJ SOLN
INTRAMUSCULAR | Status: AC
  Filled 2010-12-16: qty 2

## 2010-12-16 NOTE — Interval H&P Note (Signed)
Chart reviewed; no changes [name] [name]

## 2010-12-16 NOTE — H&P (View-Only) (Signed)
[facility]  SUBJECTIVE:No complaints this am but HR remains too high.  Telemetry: HR to 120s   Filed Vitals:   12/15/10 1106 12/15/10 1320 12/15/10 2044 12/16/10 0626  BP: 128/80 120/73 126/86 115/70  Pulse:  118 72 107  Temp:  97.7 F (36.5 C) 98.1 F (36.7 C) 97.6 F (36.4 C)  TempSrc:  Oral Oral Oral  Resp:  20 14 14   Height:    5\' 4"  (1.626 m)  Weight:    55 kg (121 lb 4.1 oz)  SpO2:  96% 93% 92%   No intake or output data in the 24 hours ending 12/16/10 0759  LABS: Basic Metabolic Panel:  Basename 12/15/10 0930 12/14/10 1223 12/13/10 2011 12/13/10 1521  NA 140 -- -- 145  K 4.0 -- -- 3.8  CL 105 -- -- 107  CO2 23 -- -- 27  GLUCOSE 120* -- -- 92  BUN 16 -- -- 18  CREATININE 0.91 -- 0.87 --  CALCIUM 9.4 -- -- 9.1  MG -- 2.2 -- --  PHOS -- -- -- --   Liver Function Tests:  Basename 12/13/10 1521  AST 16  ALT 13  ALKPHOS 65  BILITOT 0.3  PROT 6.3  ALBUMIN 3.3*   No results found for this basename: LIPASE:2,AMYLASE:2 in the last 72 hours CBC:  Basename 12/15/10 0930 12/15/10 0730  WBC 11.6* 10.3  NEUTROABS -- --  HGB 16.2* 15.2*  HCT 47.6* 46.2*  MCV 87.3 87.5  PLT 208 197   Thyroid Function Tests:  Basename 12/14/10 1223  TSH 1.207  T4TOTAL --  T3FREE --  THYROIDAB --     PHYSICAL EXAM General: NAD Neck: No JVD, no thyromegaly or thyroid nodule.  Lungs: Clear to auscultation bilaterally with normal respiratory effort. CV: Nondisplaced PMI.  Heart mildly tachycardic, irregular S1/S2, no S3/S4, no murmur.  No peripheral edema.  No carotid bruit.  Normal pedal pulses.  Abdomen: Soft, nontender, no hepatosplenomegaly, no distention.  Neurologic: Alert and oriented x 3.  Psych: Normal affect. Extremities: No clubbing or cyanosis.   TELEMETRY: Reviewed telemetry pt in atrial fibrillation with rates as high as the 120s  ASSESSMENT AND PLAN:  77 yo with history of paroxysmal atrial fibrillation admitted with atrial fibrillation and  rapid response. She became bradycardic with nodal blockade in the past.  HR is still too high on diltiazem CD 360 mg daily.  I am reticent to add more nodal blockade given history of symptomatic bradycardia with multiple nodal blocking agents in the past.  - Start heparin gtt.  She is also on coumadin. - TEE-guided cardioversion today.  - Will need heparin/coumadin overlap until INR > 2.   Lenus Trauger [employer]

## 2010-12-16 NOTE — Progress Notes (Signed)
Started Heparin gtt per orders. Gave 2500unit bolus and rate/hr= 9mL. No complications at this time.Lilia Argue, RN

## 2010-12-16 NOTE — Progress Notes (Signed)
[facility]  SUBJECTIVE:No complaints this am but HR remains too high.  Telemetry: HR to 120s   Filed Vitals:   12/15/10 1106 12/15/10 1320 12/15/10 2044 12/16/10 0626  BP: 128/80 120/73 126/86 115/70  Pulse:  118 72 107  Temp:  97.7 F (36.5 C) 98.1 F (36.7 C) 97.6 F (36.4 C)  TempSrc:  Oral Oral Oral  Resp:  20 14 14  Height:    5' 4" (1.626 m)  Weight:    55 kg (121 lb 4.1 oz)  SpO2:  96% 93% 92%   No intake or output data in the 24 hours ending 12/16/10 0759  LABS: Basic Metabolic Panel:  Basename 12/15/10 0930 12/14/10 1223 12/13/10 2011 12/13/10 1521  NA 140 -- -- 145  K 4.0 -- -- 3.8  CL 105 -- -- 107  CO2 23 -- -- 27  GLUCOSE 120* -- -- 92  BUN 16 -- -- 18  CREATININE 0.91 -- 0.87 --  CALCIUM 9.4 -- -- 9.1  MG -- 2.2 -- --  PHOS -- -- -- --   Liver Function Tests:  Basename 12/13/10 1521  AST 16  ALT 13  ALKPHOS 65  BILITOT 0.3  PROT 6.3  ALBUMIN 3.3*   No results found for this basename: LIPASE:2,AMYLASE:2 in the last 72 hours CBC:  Basename 12/15/10 0930 12/15/10 0730  WBC 11.6* 10.3  NEUTROABS -- --  HGB 16.2* 15.2*  HCT 47.6* 46.2*  MCV 87.3 87.5  PLT 208 197   Thyroid Function Tests:  Basename 12/14/10 1223  TSH 1.207  T4TOTAL --  T3FREE --  THYROIDAB --     PHYSICAL EXAM General: NAD Neck: No JVD, no thyromegaly or thyroid nodule.  Lungs: Clear to auscultation bilaterally with normal respiratory effort. CV: Nondisplaced PMI.  Heart mildly tachycardic, irregular S1/S2, no S3/S4, no murmur.  No peripheral edema.  No carotid bruit.  Normal pedal pulses.  Abdomen: Soft, nontender, no hepatosplenomegaly, no distention.  Neurologic: Alert and oriented x 3.  Psych: Normal affect. Extremities: No clubbing or cyanosis.   TELEMETRY: Reviewed telemetry pt in atrial fibrillation with rates as high as the 120s  ASSESSMENT AND PLAN:  77 yo with history of paroxysmal atrial fibrillation admitted with atrial fibrillation and  rapid response. She became bradycardic with nodal blockade in the past.  HR is still too high on diltiazem CD 360 mg daily.  I am reticent to add more nodal blockade given history of symptomatic bradycardia with multiple nodal blocking agents in the past.  - Start heparin gtt.  She is also on coumadin. - TEE-guided cardioversion today.  - Will need heparin/coumadin overlap until INR > 2.   Neftaly Inzunza    

## 2010-12-16 NOTE — Progress Notes (Signed)
ANTICOAGULATION CONSULT NOTE - Follow Up Consult  Pharmacy Consult for heparin  Indication: atrial fibrillation  Labs:  Basename 12/16/10 1953 12/16/10 0851 12/16/10 0603 12/15/10 0930 12/15/10 0730 12/14/10 1223  HGB -- 14.8 -- 16.2* -- --  HCT -- 44.9 -- 47.6* 46.2* --  PLT -- 201 -- 208 197 --  APTT -- -- -- -- -- --  LABPROT -- -- 14.4 -- 13.8 13.9  INR -- -- 1.10 -- 1.04 1.05  HEPARINUNFRC 0.57 -- -- -- -- --  CREATININE -- 0.96 -- 0.91 -- --  CKTOTAL -- -- -- -- -- --  CKMB -- -- -- -- -- --  TROPONINI -- -- -- -- -- --   Estimated Creatinine Clearance: 37 ml/min (by C-G formula based on Cr of 0.96).   Assessment: 87 YOF on heparin/coumadin for afib with RVR. INR is subtherapeutic, heparin infusion was started at 1330, TEE DCCV rescheduled for tomorrow. Pt. Heparin level is therapeutic.   Goal of Therapy:  Heparin level 0.3-0.7 units/ml   Plan:  1) Continue heparin infusion at 900 units/hr 2) f/u am Heparin level and INR and plans after DCCV.  Riki Rusk 12/16/2010,8:45 PM

## 2010-12-16 NOTE — Progress Notes (Signed)
ANTICOAGULATION CONSULT NOTE - Initial Consult  Pharmacy Consult for heparin/coumadin Indication: atrial fibrillation  Allergies  Allergen Reactions  . Ace Inhibitors     REACTION: angioedema  . Famotidine     REACTION: tongue swelling  . Felodipine     REACTION: ? angioedma after taking for 26 days---UNCLEAR  . Fexofenadine     REACTION: knees buckled  . Flecainide Nausea Only  . Lisinopril     REACTION: itching, rash  . Metoprolol Tartrate     REACTION: vertigo  . Penicillins     REACTION: rash  . Pneumococcal Vaccine Polyvalent     REACTION: unspecified    Patient Measurements: Height: 5\' 4"  (162.6 cm) Weight: 121 lb 4.1 oz (55 kg) IBW/kg (Calculated) : 54.7  Adjusted Body Weight: 55kg  Vital Signs: Temp: 97.6 F (36.4 C) (11/14 0626) Temp src: Oral (11/14 0626) BP: 115/70 mmHg (11/14 0626) Pulse Rate: 107  (11/14 0626)  Labs:  Basename 12/16/10 0603 12/15/10 0930 12/15/10 0730 12/14/10 1223 12/13/10 2011 12/13/10 1521  HGB -- 16.2* 15.2* -- -- --  HCT -- 47.6* 46.2* -- 41.7 --  PLT -- 208 197 -- 202 --  APTT -- -- -- -- -- --  LABPROT 14.4 -- 13.8 13.9 -- --  INR 1.10 -- 1.04 1.05 -- --  HEPARINUNFRC -- -- -- -- -- --  CREATININE -- 0.91 -- -- 0.87 0.93  CKTOTAL -- -- -- -- -- --  CKMB -- -- -- -- -- --  TROPONINI -- -- -- -- -- --   Estimated Creatinine Clearance: 39 ml/min (by C-G formula based on Cr of 0.91).  Medical History: Past Medical History  Diagnosis Date  . Hypertension   . Osteopenia   . Cerebrovascular disease   . Angioedema     2/2 ACE inhibitors  . Arthritis   . Stroke   . Mild memory disturbances not amounting to dementia   . Atrial tachycardia     a.  eval for syncope 3/12 with event monitor that demonstrated WCT felt to likely be ATach with aberrancy and Fleacainide started in 4/12;   b.  echo 05/08/10: EF 55-60%, mod LVH, trivial AS (AVA 1.4, mean gradient 24), trivial AI, mod MAC with mean gradient 10 mmHg, mild to mod LAE and  mild RVH    Medications:  Scheduled:    . diltiazem  360 mg Oral Daily  . multivitamins ther. w/minerals  1 tablet Oral Daily  . olmesartan  20 mg Oral Daily  . sodium chloride  3 mL Intravenous Q12H  . sodium chloride  3 mL Intravenous Q12H  . warfarin  4 mg Oral ONCE-1800  . warfarin  4 mg Oral ONCE-1800  . DISCONTD: aspirin EC  81 mg Oral Daily  . DISCONTD: enoxaparin (LOVENOX) injection  40 mg Subcutaneous Q24H  . DISCONTD: sodium chloride  3 mL Intravenous Q12H  . DISCONTD: sodium chloride  3 mL Intravenous Q12H  . DISCONTD: sodium chloride  3 mL Intravenous Q12H    Assessment: 87 YOF with afib with RVR.  Started on coumadin 11/12, but dose was not given per RN.  First dose was last night.  Orders this morning to start warfarin.  Plan is TTE cardioversion.  INR is subtherapeutic. No bleeding noted.  Goal of Therapy:  INR 2-3 Heparin level 0.3-0.7 units/ml   Plan:  1. Heparin 2500 unit bolus then 900 units/hr 2. Check 8hr heparin level, daily CBC 3. Coumadin 4mg  PO x 1 4. D/C  lovenox SQ  Dannielle Huh 12/16/2010,8:55 AM

## 2010-12-16 NOTE — Procedures (Signed)
Patient arrived to endoscopy and was not on heparin; INR subtherapeutic; anesthesia also not available. Procedure cancelled. Begin heparin, NPO after MN. TEE DCCV in AM. Olga Millers

## 2010-12-17 ENCOUNTER — Encounter (HOSPITAL_COMMUNITY): Payer: Self-pay | Admitting: *Deleted

## 2010-12-17 ENCOUNTER — Encounter (HOSPITAL_COMMUNITY)
Admission: EM | Disposition: A | Discharge status: Home / Self Care | Payer: Self-pay | Source: Home / Self Care | Attending: Cardiology

## 2010-12-17 HISTORY — PX: TEE WITHOUT CARDIOVERSION: SHX5443

## 2010-12-17 HISTORY — PX: CARDIOVERSION: SHX1299

## 2010-12-17 LAB — BASIC METABOLIC PANEL
BUN: 17 mg/dL (ref 6–23)
Calcium: 8.9 mg/dL (ref 8.4–10.5)
GFR calc Af Amer: 65 mL/min — ABNORMAL LOW (ref >90)
GFR calc non Af Amer: 56 mL/min — ABNORMAL LOW (ref >90)
Glucose, Bld: 127 mg/dL — ABNORMAL HIGH (ref 70–99)
Sodium: 141 mEq/L (ref 135–145)

## 2010-12-17 LAB — CBC
HCT: 43 % (ref 36.0–46.0)
Hemoglobin: 14.2 g/dL (ref 12.0–15.0)
MCH: 29.1 pg (ref 26.0–34.0)
MCHC: 33 g/dL (ref 30.0–36.0)
MCHC: 33 g/dL (ref 30.0–36.0)
MCV: 88.3 fL (ref 78.0–100.0)
Platelets: 190 10*3/uL (ref 150–400)
RBC: 5.47 MIL/uL — ABNORMAL HIGH (ref 3.87–5.11)
RDW: 15 % (ref 11.5–15.5)

## 2010-12-17 SURGERY — ECHOCARDIOGRAM, TRANSESOPHAGEAL
Anesthesia: Moderate Sedation

## 2010-12-17 MED ORDER — BENZOCAINE 20 % MT SOLN
1.0000 "application " | OROMUCOSAL | Status: DC | PRN
  Filled 2010-12-17: qty 57

## 2010-12-17 MED ORDER — FENTANYL CITRATE 0.05 MG/ML IJ SOLN: 250.0000 ug | Freq: Once | INTRAMUSCULAR | Status: DC

## 2010-12-17 MED ORDER — MIDAZOLAM HCL 10 MG/2ML IJ SOLN
INTRAMUSCULAR | Status: AC
  Filled 2010-12-17: qty 4

## 2010-12-17 MED ORDER — MIDAZOLAM HCL 10 MG/2ML IJ SOLN: 10.0000 mg | Freq: Once | INTRAMUSCULAR | Status: DC

## 2010-12-17 MED ORDER — FENTANYL CITRATE 0.05 MG/ML IJ SOLN
INTRAMUSCULAR | Status: AC
  Filled 2010-12-17: qty 4

## 2010-12-17 MED ORDER — SODIUM CHLORIDE 0.9 % IJ SOLN: 3.0000 mL | INTRAMUSCULAR | Status: DC | PRN

## 2010-12-17 MED ORDER — BENZOCAINE 20 % MT SOLN: 1.0000 "application " | OROMUCOSAL | Status: DC | PRN

## 2010-12-17 MED ORDER — WARFARIN SODIUM 4 MG PO TABS
4.0000 mg | ORAL_TABLET | Freq: Once | ORAL | Status: DC
Start: 2010-12-17 — End: 2010-12-18
  Filled 2010-12-17: qty 1

## 2010-12-17 MED ORDER — FENTANYL CITRATE 0.05 MG/ML IJ SOLN
INTRAMUSCULAR | Status: DC | PRN
  Administered 2010-12-17 (×5): 25 ug via INTRAVENOUS

## 2010-12-17 MED ORDER — MIDAZOLAM HCL 10 MG/2ML IJ SOLN
INTRAMUSCULAR | Status: DC | PRN
  Administered 2010-12-17: 2 mg via INTRAVENOUS
  Administered 2010-12-17 (×3): 1 mg via INTRAVENOUS

## 2010-12-17 MED ORDER — BUTAMBEN-TETRACAINE-BENZOCAINE 2-2-14 % EX AERO
INHALATION_SPRAY | CUTANEOUS | Status: DC | PRN
  Administered 2010-12-17: 2 via TOPICAL

## 2010-12-17 MED ORDER — SODIUM CHLORIDE 0.9 % IJ SOLN
3.0000 mL | Freq: Two times a day (BID) | INTRAMUSCULAR | Status: DC
  Administered 2010-12-17 – 2010-12-19 (×4): 3 mL via INTRAVENOUS

## 2010-12-17 MED ORDER — SODIUM CHLORIDE 0.45 % IV SOLN: INTRAVENOUS | Status: DC

## 2010-12-17 MED ORDER — DILTIAZEM HCL ER COATED BEADS 180 MG PO CP24
180.0000 mg | ORAL_CAPSULE | Freq: Every day | ORAL | Status: DC
  Administered 2010-12-18 – 2010-12-19 (×2): 180 mg via ORAL
  Filled 2010-12-17 (×2): qty 1

## 2010-12-17 MED ORDER — SODIUM CHLORIDE 0.9 % IV SOLN
INTRAVENOUS | Status: DC
  Administered 2010-12-17: 20:00:00 via INTRAVENOUS

## 2010-12-17 NOTE — Progress Notes (Signed)
ANTICOAGULATION CONSULT NOTE - Follow Up Consult  Pharmacy Consult for heparin/coumadin Indication: atrial fibrillation  Allergies  Allergen Reactions  . Ace Inhibitors     REACTION: angioedema  . Famotidine     REACTION: tongue swelling  . Felodipine     REACTION: ? angioedma after taking for 26 days---UNCLEAR  . Fexofenadine     REACTION: knees buckled  . Flecainide Nausea Only  . Lisinopril     REACTION: itching, rash  . Metoprolol Tartrate     REACTION: vertigo  . Penicillins     REACTION: rash  . Pneumococcal Vaccine Polyvalent     REACTION: unspecified    Patient Measurements: Height: 5\' 4"  (162.6 cm) Weight: 121 lb 4.1 oz (55 kg) IBW/kg (Calculated) : 54.7  Adjusted Body Weight: 55kg  Vital Signs: Temp: 98.1 F (36.7 C) (11/15 1422) Temp src: Oral (11/15 1422) BP: 129/86 mmHg (11/15 1422) Pulse Rate: 58  (11/15 1422)  Labs:  Basename 12/17/10 1000 12/17/10 0548 12/16/10 1953 12/16/10 0851 12/16/10 0603 12/15/10 0930 12/15/10 0730  HGB 15.9* 14.2 -- -- -- -- --  HCT 48.2* 43.0 -- 44.9 -- -- --  PLT 190 192 -- 201 -- -- --  APTT -- -- -- -- -- -- --  LABPROT -- 18.1* -- -- 14.4 -- 13.8  INR -- 1.47 -- -- 1.10 -- 1.04  HEPARINUNFRC -- 0.63 0.57 -- -- -- --  CREATININE 0.91 -- -- 0.96 -- 0.91 --  CKTOTAL -- -- -- -- -- -- --  CKMB -- -- -- -- -- -- --  TROPONINI -- -- -- -- -- -- --   Estimated Creatinine Clearance: 39 ml/min (by C-G formula based on Cr of 0.91).   Medications:  Scheduled:    . diltiazem  360 mg Oral Daily  . fentaNYL  250 mcg Intravenous Once  . midazolam  10 mg Intravenous Once  . multivitamins ther. w/minerals  1 tablet Oral Daily  . olmesartan  20 mg Oral Daily  . sodium chloride  3 mL Intravenous Q12H  . warfarin  4 mg Oral ONCE-1800  . DISCONTD: fentaNYL  250 mcg Intravenous Once  . DISCONTD: midazolam  10 mg Intravenous Once  . DISCONTD: sodium chloride  3 mL Intravenous Q12H    Assessment: 87 YOF with afib with RVR.  Started on coumadin 11/12, but dose was not given per RN. First dose was last 1113. Heparin level is therapeutic and INR is trending up although it is currently subtherapeutic. No bleeding is noted.  Goal of Therapy:  INR 2-3 Heparin level 0.3-0.7 units/ml   Plan:  1. Same Heparin rate for today.  2. Coumadin 4mg  PO x 1  Dannielle Huh 12/17/2010,3:36 PM

## 2010-12-17 NOTE — H&P (View-Only) (Signed)
[facility]  SUBJECTIVE:No complaints this am.  Still in atrial fib with HR 70s-110s (better rate control).  She has been walking without dyspnea.   Telemetry: HR to 70s-110s atrial fibrillation.    Filed Vitals:   12/16/10 1040 12/16/10 1412 12/16/10 2113 12/17/10 0423  BP: 135/90 123/86 135/78 117/93  Pulse: 126 119 82 78  Temp: 98.5 F (36.9 C) 98.1 F (36.7 C) 98.2 F (36.8 C) 98 F (36.7 C)  TempSrc: Oral Oral Oral Oral  Resp: 12 18 18 18  Height:      Weight:      SpO2: 95% 99% 96% 100%    Intake/Output Summary (Last 24 hours) at 12/17/10 0841 Last data filed at 12/17/10 0753  Gross per 24 hour  Intake    250 ml  Output   1500 ml  Net  -1250 ml    LABS: Basic Metabolic Panel:  Basename 12/16/10 0851 12/15/10 0930 12/14/10 1223  NA 139 140 --  K 3.9 4.0 --  CL 102 105 --  CO2 29 23 --  GLUCOSE 96 120* --  BUN 18 16 --  CREATININE 0.96 0.91 --  CALCIUM 9.2 9.4 --  MG -- -- 2.2  PHOS -- -- --   Liver Function Tests: No results found for this basename: AST:2,ALT:2,ALKPHOS:2,BILITOT:2,PROT:2,ALBUMIN:2 in the last 72 hours No results found for this basename: LIPASE:2,AMYLASE:2 in the last 72 hours CBC:  Basename 12/17/10 0548 12/16/10 0851  WBC 10.9* 10.2  NEUTROABS -- --  HGB 14.2 14.8  HCT 43.0 44.9  MCV 88.3 87.9  PLT 192 201   Thyroid Function Tests:  Basename 12/14/10 1223  TSH 1.207  T4TOTAL --  T3FREE --  THYROIDAB --     PHYSICAL EXAM General: NAD Neck: No JVD, no thyromegaly or thyroid nodule.  Lungs: Clear to auscultation bilaterally with normal respiratory effort. CV: Nondisplaced PMI.  Heart mildly tachycardic, irregular S1/S2, no S3/S4, no murmur.  No peripheral edema.  No carotid bruit.  Normal pedal pulses.  Abdomen: Soft, nontender, no hepatosplenomegaly, no distention.  Neurologic: Alert and oriented x 3.  Psych: Normal affect. Extremities: No clubbing or cyanosis.    ASSESSMENT AND PLAN:  77 yo with history of  paroxysmal atrial fibrillation admitted with atrial fibrillation and rapid response. She became bradycardic with nodal blockade in the past.  HR is still too high on diltiazem CD 360 mg daily.  I am reticent to add more nodal blockade given history of symptomatic bradycardia with multiple nodal blocking agents in the past.  - Continue heparin/warfarin overlap until INR > 2. - TEE-guided cardioversion today.   Amaia Lavallie    

## 2010-12-17 NOTE — Procedures (Signed)
Procedure: TEE-guided cardioversion  Indication: Atrial fibrillation  Procedure: The patient has been on IV heparin.  Patient underwent informed consent.  She then underwent TEE to rule out left atrial thrombus.  There was no left atrial thrombus found. Patient was sedated using a total of 5 mg Versed IV and 125 mcg Fentanyl.  Direct current cardioversion was then done with pads in AP position using 200 J biphasic waveform.  The patient converted to NSR with no immediate complications.   [name] [name] [employer]

## 2010-12-17 NOTE — Progress Notes (Signed)
*  PRELIMINARY RESULTS* Echocardiogram Echocardiogram Transesophageal has been performed.  Clide Deutscher RDCS 12/17/2010, 5:47 PM

## 2010-12-17 NOTE — Interval H&P Note (Signed)
History and Physical Interval Note:   12/17/2010   4:33 PM   [name] [name]  has presented today for surgery, with the diagnosis of afib  The various methods of treatment have been discussed with the patient and family. After consideration of risks, benefits and other options for treatment, the patient has consented to  Procedure(s): TRANSESOPHAGEAL ECHOCARDIOGRAM (TEE) CARDIOVERSION as a surgical intervention .  The patients' history has been reviewed, patient examined, no change in status, stable for surgery.  I have reviewed the patients' chart and labs.  Questions were answered to the patient's satisfaction.     Marca Ancona  MD

## 2010-12-17 NOTE — Progress Notes (Signed)
[facility]  SUBJECTIVE:No complaints this am.  Still in atrial fib with HR 70s-110s (better rate control).  She has been walking without dyspnea.   Telemetry: HR to 70s-110s atrial fibrillation.    Filed Vitals:   12/16/10 1040 12/16/10 1412 12/16/10 2113 12/17/10 0423  BP: 135/90 123/86 135/78 117/93  Pulse: 126 119 82 78  Temp: 98.5 F (36.9 C) 98.1 F (36.7 C) 98.2 F (36.8 C) 98 F (36.7 C)  TempSrc: Oral Oral Oral Oral  Resp: 12 18 18 18   Height:      Weight:      SpO2: 95% 99% 96% 100%    Intake/Output Summary (Last 24 hours) at 12/17/10 0841 Last data filed at 12/17/10 0753  Gross per 24 hour  Intake    250 ml  Output   1500 ml  Net  -1250 ml    LABS: Basic Metabolic Panel:  Basename 12/16/10 0851 12/15/10 0930 12/14/10 1223  NA 139 140 --  K 3.9 4.0 --  CL 102 105 --  CO2 29 23 --  GLUCOSE 96 120* --  BUN 18 16 --  CREATININE 0.96 0.91 --  CALCIUM 9.2 9.4 --  MG -- -- 2.2  PHOS -- -- --   Liver Function Tests: No results found for this basename: AST:2,ALT:2,ALKPHOS:2,BILITOT:2,PROT:2,ALBUMIN:2 in the last 72 hours No results found for this basename: LIPASE:2,AMYLASE:2 in the last 72 hours CBC:  Basename 12/17/10 0548 12/16/10 0851  WBC 10.9* 10.2  NEUTROABS -- --  HGB 14.2 14.8  HCT 43.0 44.9  MCV 88.3 87.9  PLT 192 201   Thyroid Function Tests:  Basename 12/14/10 1223  TSH 1.207  T4TOTAL --  T3FREE --  THYROIDAB --     PHYSICAL EXAM General: NAD Neck: No JVD, no thyromegaly or thyroid nodule.  Lungs: Clear to auscultation bilaterally with normal respiratory effort. CV: Nondisplaced PMI.  Heart mildly tachycardic, irregular S1/S2, no S3/S4, no murmur.  No peripheral edema.  No carotid bruit.  Normal pedal pulses.  Abdomen: Soft, nontender, no hepatosplenomegaly, no distention.  Neurologic: Alert and oriented x 3.  Psych: Normal affect. Extremities: No clubbing or cyanosis.    ASSESSMENT AND PLAN:  77 yo with history of  paroxysmal atrial fibrillation admitted with atrial fibrillation and rapid response. She became bradycardic with nodal blockade in the past.  HR is still too high on diltiazem CD 360 mg daily.  I am reticent to add more nodal blockade given history of symptomatic bradycardia with multiple nodal blocking agents in the past.  - Continue heparin/warfarin overlap until INR > 2. - TEE-guided cardioversion today.   [name] [name] [employer]

## 2010-12-18 LAB — CBC
MCH: 29 pg (ref 26.0–34.0)
MCHC: 33.1 g/dL (ref 30.0–36.0)
MCV: 88.7 fL (ref 78.0–100.0)
Platelets: 177 10*3/uL (ref 150–400)
Platelets: 172 10*3/uL (ref 150–400)
RDW: 15 % (ref 11.5–15.5)
RDW: 14.9 % (ref 11.5–15.5)
WBC: 9.3 10*3/uL (ref 4.0–10.5)

## 2010-12-18 LAB — BASIC METABOLIC PANEL
BUN: 13 mg/dL (ref 6–23)
Calcium: 8.8 mg/dL (ref 8.4–10.5)
Chloride: 106 mEq/L (ref 96–112)
Creatinine, Ser: 0.91 mg/dL (ref 0.50–1.10)
GFR calc Af Amer: 65 mL/min — ABNORMAL LOW (ref >90)
GFR calc non Af Amer: 56 mL/min — ABNORMAL LOW (ref >90)

## 2010-12-18 LAB — PROTIME-INR
INR: 1.86 — ABNORMAL HIGH (ref 0.00–1.49)
Prothrombin Time: 21.8 seconds — ABNORMAL HIGH (ref 11.6–15.2)

## 2010-12-18 MED ORDER — WARFARIN SODIUM 3 MG PO TABS
3.0000 mg | ORAL_TABLET | Freq: Once | ORAL | Status: AC
  Administered 2010-12-18: 3 mg via ORAL
  Filled 2010-12-18: qty 1

## 2010-12-18 NOTE — Progress Notes (Signed)
Utilization review completed. Eliah Ozawa, RN, BSN. 12/18/10 

## 2010-12-18 NOTE — Progress Notes (Signed)
ANTICOAGULATION CONSULT NOTE - Follow Up Consult  Pharmacy Consult for coumadin and heparin Indication: atrial fibrillation  Allergies  Allergen Reactions  . Ace Inhibitors     REACTION: angioedema  . Famotidine     REACTION: tongue swelling  . Felodipine     REACTION: ? angioedma after taking for 26 days---UNCLEAR  . Fexofenadine     REACTION: knees buckled  . Flecainide Nausea Only  . Lisinopril     REACTION: itching, rash  . Metoprolol Tartrate     REACTION: vertigo  . Penicillins     REACTION: rash  . Pneumococcal Vaccine Polyvalent     REACTION: unspecified    Patient Measurements: Height: 5\' 4"  (162.6 cm) Weight: 121 lb 4.1 oz (55 kg) IBW/kg (Calculated) : 54.7  Adjusted Body Weight:   Vital Signs: Temp: 98.1 F (36.7 C) (11/16 0546) Temp src: Oral (11/16 0546) BP: 136/60 mmHg (11/16 0546) Pulse Rate: 53  (11/16 0546)  Labs:  Basename 12/18/10 0918 12/18/10 0600 12/17/10 1000 12/17/10 0548 12/16/10 1953 12/16/10 0851 12/16/10 0603  HGB 14.5 13.6 -- -- -- -- --  HCT 43.8 41.6 48.2* -- -- -- --  PLT 172 177 190 -- -- -- --  APTT -- -- -- -- -- -- --  LABPROT -- 21.8* -- 18.1* -- -- 14.4  INR -- 1.86* -- 1.47 -- -- 1.10  HEPARINUNFRC -- 0.55 -- 0.63 0.57 -- --  CREATININE -- -- 0.91 -- -- 0.96 --  CKTOTAL -- -- -- -- -- -- --  CKMB -- -- -- -- -- -- --  TROPONINI -- -- -- -- -- -- --   Estimated Creatinine Clearance: 39 ml/min (by C-G formula based on Cr of 0.91).   Medications:  Scheduled:    . diltiazem  180 mg Oral Daily  . multivitamins ther. w/minerals  1 tablet Oral Daily  . olmesartan  20 mg Oral Daily  . sodium chloride  3 mL Intravenous Q12H  . sodium chloride  3 mL Intravenous Q12H  . warfarin  3 mg Oral ONCE-1800  . warfarin  4 mg Oral ONCE-1800  . DISCONTD: diltiazem  360 mg Oral Daily  . DISCONTD: fentaNYL  250 mcg Intravenous Once  . DISCONTD: fentaNYL  250 mcg Intravenous Once  . DISCONTD: midazolam  10 mg Intravenous Once  .  DISCONTD: midazolam  10 mg Intravenous Once    Assessment: 77 yo female with afib is currently on therapeutic coumadin and heparin.  INR jumped by a bit today Goal of Therapy:  INR 2-3; 8hr heparin level 0.3-0.7   Plan:  1) Cont heparin at 900 units/hr 2) Coumadin 3mg  po x1 3) Daily PT/INR and heparin level in am  Sheccid Lahmann, Tsz-Yin 12/18/2010,10:05 AM

## 2010-12-18 NOTE — Progress Notes (Signed)
[facility]  SUBJECTIVE:No complaints this am.  Status post TEE-guided cardioversion yesterday, she remains in NSR this morning.   Filed Vitals:   12/17/10 1810 12/17/10 1902 12/17/10 2058 12/18/10 0546  BP: 112/55 127/74 115/54 136/60  Pulse:  52 54 53  Temp:   97.9 F (36.6 C) 98.1 F (36.7 C)  TempSrc:   Oral Oral  Resp: 13 16 14 14   Height:      Weight:      SpO2: 97% 96% 93% 94%    Intake/Output Summary (Last 24 hours) at 12/18/10 0721 Last data filed at 12/18/10 0547  Gross per 24 hour  Intake      0 ml  Output   1750 ml  Net  -1750 ml    LABS: Basic Metabolic Panel:  Basename 12/17/10 1000 12/16/10 0851  NA 141 139  K 4.2 3.9  CL 105 102  CO2 26 29  GLUCOSE 127* 96  BUN 17 18  CREATININE 0.91 0.96  CALCIUM 8.9 9.2  MG -- --  PHOS -- --   CBC:  Basename 12/18/10 0600 12/17/10 1000  WBC 8.7 11.8*  NEUTROABS -- --  HGB 13.6 15.9*  HCT 41.6 48.2*  MCV 88.7 88.1  PLT 177 190    PHYSICAL EXAM General: NAD Neck: No JVD, no thyromegaly or thyroid nodule.  Lungs: Clear to auscultation bilaterally with normal respiratory effort. CV: Nondisplaced PMI.  Heart regular S1/S2, no S3/S4, no murmur.  No peripheral edema.  No carotid bruit.  Normal pedal pulses.  Abdomen: Soft, nontender, no hepatosplenomegaly, no distention.  Neurologic: Alert and oriented x 3.  Psych: Normal affect. Extremities: No clubbing or cyanosis.    ASSESSMENT AND PLAN:  77 yo with history of paroxysmal atrial fibrillation admitted with atrial fibrillation and rapid response. She became bradycardic with multiple AV nodal blockers in the past. Now s/p TEE-guided cardioversion and in NSR.   - Continue heparin/warfarin overlap until INR > 2, anticipate discharge tomorrow.  - Diltiazem CD decreased to 180 mg daily.   Joran Kallal [employer]

## 2010-12-19 LAB — HEPARIN LEVEL (UNFRACTIONATED): Heparin Unfractionated: 0.33 IU/mL (ref 0.30–0.70)

## 2010-12-19 LAB — CBC
MCH: 28.7 pg (ref 26.0–34.0)
MCHC: 32.4 g/dL (ref 30.0–36.0)
RDW: 14.9 % (ref 11.5–15.5)

## 2010-12-19 LAB — PROTIME-INR
INR: 1.85 — ABNORMAL HIGH (ref 0.00–1.49)
Prothrombin Time: 21.7 seconds — ABNORMAL HIGH (ref 11.6–15.2)

## 2010-12-19 MED ORDER — WARFARIN SODIUM 4 MG PO TABS
4.0000 mg | ORAL_TABLET | Freq: Once | ORAL | Status: DC
  Filled 2010-12-19: qty 1

## 2010-12-19 MED ORDER — WARFARIN SODIUM 4 MG PO TABS: 4.0000 mg | ORAL_TABLET | ORAL | Status: DC

## 2010-12-19 MED ORDER — DILTIAZEM HCL ER COATED BEADS 180 MG PO CP24: 180.0000 mg | ORAL_CAPSULE | Freq: Every day | ORAL | Status: DC

## 2010-12-19 NOTE — Progress Notes (Signed)
SUBJECTIVE:  Patient continues to do well. She is maintaining sinus rhythm. Her INR is pending this morning but should be adequate for her to go home later today.  Filed Vitals:   12/18/10 0546 12/18/10 1408 12/18/10 2113 12/19/10 0601  BP: 136/60 150/66 156/65 156/67  Pulse: 53 69 61 67  Temp: 98.1 F (36.7 C) 98.3 F (36.8 C) 97.5 F (36.4 C) 98.4 F (36.9 C)  TempSrc: Oral Oral Oral Oral  Resp: 14 16 16 16   Height:      Weight:      SpO2: 94% 99% 97% 95%    Intake/Output Summary (Last 24 hours) at 12/19/10 0803 Last data filed at 12/19/10 0045  Gross per 24 hour  Intake    480 ml  Output    700 ml  Net   -220 ml    LABS: Basic Metabolic Panel:  Basename 12/18/10 0918 12/17/10 1000  NA 140 141  K 3.9 4.2  CL 106 105  CO2 26 26  GLUCOSE 121* 127*  BUN 13 17  CREATININE 0.91 0.91  CALCIUM 8.8 8.9  MG -- --  PHOS -- --   Liver Function Tests: No results found for this basename: AST:2,ALT:2,ALKPHOS:2,BILITOT:2,PROT:2,ALBUMIN:2 in the last 72 hours No results found for this basename: LIPASE:2,AMYLASE:2 in the last 72 hours CBC:  Basename 12/18/10 0918 12/18/10 0600  WBC 9.3 8.7  NEUTROABS -- --  HGB 14.5 13.6  HCT 43.8 41.6  MCV 88.5 88.7  PLT 172 177   Cardiac Enzymes: No results found for this basename: CKTOTAL:3,CKMB:3,CKMBINDEX:3,TROPONINI:3 in the last 72 hours BNP: No results found for this basename: POCBNP:3 in the last 72 hours D-Dimer: No results found for this basename: DDIMER:2 in the last 72 hours Hemoglobin A1C: No results found for this basename: HGBA1C in the last 72 hours Fasting Lipid Panel: No results found for this basename: CHOL,HDL,LDLCALC,TRIG,CHOLHDL,LDLDIRECT in the last 72 hours Thyroid Function Tests: No results found for this basename: TSH,T4TOTAL,FREET3,T3FREE,THYROIDAB in the last 72 hours  RADIOLOGY: Dg Chest 2 View  12/13/2010  *RADIOLOGY REPORT*  Clinical Data: New onset seizure. Altered level of consciousness.   CHEST - 2 VIEW  Comparison: 04/30/2010  Findings: There is mild cardiomegaly with tortuosity of the thoracic aorta.  No acute infiltrates or effusions.  Chronic peribronchial thickening.  No acute osseous abnormalities.  No effusions.  IMPRESSION: Mild cardiomegaly.  Chronic bronchitic changes.  Original Report Authenticated By: Gwynn Burly, M.D.   Ct Head Wo Contrast  12/13/2010  *RADIOLOGY REPORT*  Clinical Data: New onset seizure  CT HEAD WITHOUT CONTRAST  Technique:  Contiguous axial images were obtained from the base of the skull through the vertex without contrast.  Comparison: 05/05/2010  Findings: No evidence of parenchymal hemorrhage or extra-axial fluid collection. No mass lesion, mass effect, or midline shift.  No CT evidence of acute infarction.  Old right basal ganglia lacunar infarct extending into the corona radiata.  Stable mild hypodensity in the right internal capsule extending into the right cerebral peduncle/midbrain (series 2/image 11), possibly reflecting wallerian degeneration.  Small vessel ischemic changes.  Intracranial atherosclerosis.  Age related atrophy.  The visualized paranasal sinuses are essentially clear. The mastoid air cells are unopacified.  No evidence of calvarial fracture.  IMPRESSION: No evidence of acute intracranial abnormality.  Old right basal ganglia lacunar infarct with associated wallerian degeneration.  Atrophy with small vessel ischemic changes and intracranial atherosclerosis.  Original Report Authenticated By: Charline Bills, M.D.    PHYSICAL EXAM  the  patient continues to have very poor short-term memory. Her son is in the room and he is well aware that she is going home today of her INR is okay. She is stable.  TELEMETRY: Telemetry is reviewed by me. She has normal sinus rhythm.   ASSESSMENT AND PLAN:  Principal Problem:   *Atrial fibrillation       The patient was converted to sinus rhythm. She is holding sinus rhythm. She will be ready  to go home today. He'll be a full discharge note done separately from this note. Active Problems:  HYPERTENSION  STROKE  Atrial tachycardia, paroxysmal  Dizziness  Memory change  Syncope   Willa Rough 12/19/2010 8:03 AM

## 2010-12-19 NOTE — Discharge Summary (Signed)
Physician Discharge Summary  Patient ID: [name] [name] MRN: [id number] DOB/AGE: [birth year] 77 y.o.  Admit date: 12/13/2010 Discharge date: 12/19/2010  Primary Discharge Diagnosis  PAF RVR  A)  New onset; s/p successful TEE DCCV  B) Coumadin anticoagulation initiated  Secondary Discharge Diagnosis Atrial tachycardia Stroke NL LVF HTN Angioedema (ACE inhibitors) Memory disturbance, mild   Hospital Course:  Patient presented to the ED with c/o dizziness, but no frank syncope. She was found to be in new onset PAF with RVR, and was started on IV diltiazem. She was subsequently deemed a suitable candidate and was started on Coumadin, which was new, with IV Heparin overlap. INR at D/C was 1.9.  A CT scan of the head was performed on admission, which was negative for any acute changes.  Diltiazem was decreased, secondary to sinus bradycardia in the 50s, from 360 mg daily, to a final dose of 180 daily. She was referred for TEE DCCV, which was negative for thrombus, and pt was successfully cardioverted with 220J to NSR, wherein she remained, by time of discharge.  Discharge Vitals: Blood pressure 175/69, pulse 67, temperature 98.4 F (36.9 C), temperature source Oral, resp. rate 16, height 5\' 4"  (1.626 m), weight 121 lb 4.1 oz (55 kg), SpO2 95.00%.  Labs:   Lab Results  Component Value Date   WBC 9.1 12/19/2010   HGB 13.2 12/19/2010   HCT 40.7 12/19/2010   MCV 88.5 12/19/2010   PLT 177 12/19/2010    Lab 12/18/10 0918 12/13/10 1521  NA 140 --  K 3.9 --  CL 106 --  CO2 26 --  BUN 13 --  CREATININE 0.91 --  CALCIUM 8.8 --  PROT -- 6.3  BILITOT -- 0.3  ALKPHOS -- 65  ALT -- 13  AST -- 16  GLUCOSE 121* --   Lab Results  Component Value Date   CKTOTAL 31 05/01/2010   CKMB 0.9 05/01/2010   TROPONINI  Value: 0.02        NO INDICATION OF MYOCARDIAL INJURY. 05/01/2010    Lab Results  Component Value Date   CHOL  Value: 168        ATP III CLASSIFICATION:  <200     mg/dL    Desirable  409-811  mg/dL   Borderline High  >=914    mg/dL   High        7/82/9562   CHOL 163 04/02/2009   CHOL 177 05/25/2007   Lab Results  Component Value Date   HDL 49 05/01/2010   HDL 57.90 04/02/2009   HDL 57.8 05/25/2007   Lab Results  Component Value Date   LDLCALC  Value: 97        Total Cholesterol/HDL:CHD Risk Coronary Heart Disease Risk Table                     Men   Women  1/2 Average Risk   3.4   3.3  Average Risk       5.0   4.4  2 X Average Risk   9.6   7.1  3 X Average Risk  23.4   11.0        Use the calculated Patient Ratio above and the CHD Risk Table to determine the patient's CHD Risk.        ATP III CLASSIFICATION (LDL):  <100     mg/dL   Optimal  130-865  mg/dL   Near or Above  Optimal  130-159  mg/dL   Borderline  244-010  mg/dL   High  >272     mg/dL   Very High 5/36/6440   LDLCALC 85 04/02/2009   LDLCALC 103* 05/25/2007   Lab Results  Component Value Date   TRIG 108 05/01/2010   TRIG 102.0 04/02/2009   TRIG 83 05/25/2007   Lab Results  Component Value Date   CHOLHDL 3.4 05/01/2010   CHOLHDL 3 04/02/2009   CHOLHDL 3.1 CALC 05/25/2007   No results found for this basename: LDLDIRECT      Radiology: Head CT, on Admission, indicated no acute changes; old right basal ganglia lacunar infarct; atrophy with small vessel ischemic changes.   FOLLOW UP PLANS AND APPOINTMENTS Discharge Orders    Future Orders Please Complete By Expires   Diet - low sodium heart healthy      Increase activity slowly        Current Discharge Medication List    START taking these medications   Details  diltiazem (CARDIZEM CD) 180 MG 24 hr capsule Take 1 capsule (180 mg total) by mouth daily. Qty: 30 capsule, Refills: 6    warfarin (COUMADIN) 4 MG tablet Take 1 tablet (4 mg total) by mouth as directed. Continue taking 4 mg daily, pending further instructions from the MD, including 4 mg today Qty: 30 tablet, Refills: 6      CONTINUE these medications which have NOT  CHANGED   Details  LORazepam (ATIVAN) 0.5 MG tablet Take 0.5 mg by mouth as needed. For anxiety    Multiple Vitamin (MULTIVITAMIN) tablet Take 1 tablet by mouth daily.      olmesartan (BENICAR) 20 MG tablet Take 1 tablet (20 mg total) by mouth daily. Qty: 90 tablet, Refills: 6      STOP taking these medications     amLODipine (NORVASC) 5 MG tablet      Ascorbic Acid (VITAMIN C) 500 MG tablet      aspirin 81 MG tablet      ondansetron (ZOFRAN) 4 MG tablet      triamcinolone (KENALOG) 0.1 % cream      ondansetron (ZOFRAN) 4 MG tablet          Follow-up Information    Follow up with Marca Ancona, MD. (office will call and arrange)    Contact information:   [address] [place] [address] [phone]       Follow up with coumadin clinic. (PT/INR on Monday, 11/19, office to arrange)          BRING ALL MEDICATIONS WITH YOU TO FOLLOW UP APPOINTMENTS  Time spent with patient to include physician time: Greater than 30 minutes, including physician time. Signed: Gene Mariamawit Depaoli 12/19/2010, 2:18 PM Co-Sign MD

## 2010-12-19 NOTE — Progress Notes (Signed)
ANTICOAGULATION CONSULT NOTE - Follow Up Consult  Pharmacy Consult for heparin and coumadin Indication: atrial fibrillation  Allergies  Allergen Reactions  . Ace Inhibitors     REACTION: angioedema  . Famotidine     REACTION: tongue swelling  . Felodipine     REACTION: ? angioedma after taking for 26 days---UNCLEAR  . Fexofenadine     REACTION: knees buckled  . Flecainide Nausea Only  . Lisinopril     REACTION: itching, rash  . Metoprolol Tartrate     REACTION: vertigo  . Penicillins     REACTION: rash  . Pneumococcal Vaccine Polyvalent     REACTION: unspecified    Patient Measurements: Height: 5\' 4"  (162.6 cm) Weight: 121 lb 4.1 oz (55 kg) IBW/kg (Calculated) : 54.7  Adjusted Body Weight: 55kg  Vital Signs: Temp: 98.4 F (36.9 C) (11/17 0601) Temp src: Oral (11/17 0601) BP: 175/69 mmHg (11/17 1042) Pulse Rate: 67  (11/17 0601)  Labs:  Basename 12/19/10 0740 12/18/10 0918 12/18/10 0600 12/17/10 1000 12/17/10 0548  HGB 13.2 14.5 -- -- --  HCT 40.7 43.8 41.6 -- --  PLT 177 172 177 -- --  APTT -- -- -- -- --  LABPROT 21.7* -- 21.8* -- 18.1*  INR 1.85* -- 1.86* -- 1.47  HEPARINUNFRC 0.33 -- 0.55 -- 0.63  CREATININE -- 0.91 -- 0.91 --  CKTOTAL -- -- -- -- --  CKMB -- -- -- -- --  TROPONINI -- -- -- -- --   Estimated Creatinine Clearance: 39 ml/min (by C-G formula based on Cr of 0.91).   Medications:  Scheduled:    . diltiazem  180 mg Oral Daily  . multivitamins ther. w/minerals  1 tablet Oral Daily  . olmesartan  20 mg Oral Daily  . sodium chloride  3 mL Intravenous Q12H  . sodium chloride  3 mL Intravenous Q12H  . warfarin  3 mg Oral ONCE-1800  . warfarin  4 mg Oral ONCE-1800    Assessment: 77yo female with afib is currently on subtherapuetic coumadin bridging with therapeutic heparin. May d/c today Goal of Therapy:  INR 2-3; 8hr heparin level 0.3-0.7   Plan:  1) Cont heparin at 900 units/hr 2) Coumadin 4mg  po x1 (Can give before d/c today if  d/c.  If given, advise patient not to take any coumadin tonight.  Keasha Malkiewicz, Tsz-Yin 12/19/2010,11:24 AM

## 2010-12-21 ENCOUNTER — Ambulatory Visit (INDEPENDENT_AMBULATORY_CARE_PROVIDER_SITE_OTHER): Payer: Medicare Other | Admitting: *Deleted

## 2010-12-21 DIAGNOSIS — Z7901 Long term (current) use of anticoagulants: Secondary | ICD-10-CM

## 2010-12-21 DIAGNOSIS — I4891 Unspecified atrial fibrillation: Secondary | ICD-10-CM

## 2010-12-21 LAB — POCT INR: INR: 3.2

## 2010-12-25 ENCOUNTER — Ambulatory Visit (INDEPENDENT_AMBULATORY_CARE_PROVIDER_SITE_OTHER): Payer: Medicare Other | Admitting: *Deleted

## 2010-12-25 ENCOUNTER — Ambulatory Visit: Payer: Medicare Other | Admitting: Internal Medicine

## 2010-12-25 DIAGNOSIS — Z7901 Long term (current) use of anticoagulants: Secondary | ICD-10-CM

## 2010-12-25 DIAGNOSIS — I4891 Unspecified atrial fibrillation: Secondary | ICD-10-CM

## 2010-12-25 LAB — POCT INR: INR: 2.5

## 2010-12-28 ENCOUNTER — Encounter (HOSPITAL_COMMUNITY): Payer: Self-pay | Admitting: Cardiology

## 2011-01-01 ENCOUNTER — Telehealth: Payer: Self-pay | Admitting: Cardiology

## 2011-01-01 ENCOUNTER — Ambulatory Visit (INDEPENDENT_AMBULATORY_CARE_PROVIDER_SITE_OTHER): Payer: Medicare Other | Admitting: *Deleted

## 2011-01-01 DIAGNOSIS — I4891 Unspecified atrial fibrillation: Secondary | ICD-10-CM

## 2011-01-01 DIAGNOSIS — Z7901 Long term (current) use of anticoagulants: Secondary | ICD-10-CM

## 2011-01-01 NOTE — Telephone Encounter (Signed)
Think these should be ok.  Do not overdose the vitamin D, might ought to go through PCP for that one.

## 2011-01-01 NOTE — Telephone Encounter (Signed)
New problem Pt's son wants her to take supplements please call if ok Vitamin d crill oil dmae l-carnosine astaxanthine

## 2011-01-01 NOTE — Telephone Encounter (Signed)
LMTCB

## 2011-01-04 NOTE — Telephone Encounter (Signed)
Pt is aware of  Dr McLean's recommendations. 

## 2011-01-04 NOTE — Telephone Encounter (Signed)
I discussed with pt 

## 2011-01-08 ENCOUNTER — Ambulatory Visit (INDEPENDENT_AMBULATORY_CARE_PROVIDER_SITE_OTHER): Payer: Medicare Other | Admitting: *Deleted

## 2011-01-08 DIAGNOSIS — I4891 Unspecified atrial fibrillation: Secondary | ICD-10-CM

## 2011-01-08 DIAGNOSIS — Z7901 Long term (current) use of anticoagulants: Secondary | ICD-10-CM

## 2011-01-12 ENCOUNTER — Encounter: Payer: Medicare Other | Admitting: Cardiology

## 2011-01-12 ENCOUNTER — Encounter: Payer: Self-pay | Admitting: Cardiology

## 2011-01-12 ENCOUNTER — Ambulatory Visit (INDEPENDENT_AMBULATORY_CARE_PROVIDER_SITE_OTHER): Payer: Medicare Other | Admitting: Cardiology

## 2011-01-12 DIAGNOSIS — I10 Essential (primary) hypertension: Secondary | ICD-10-CM

## 2011-01-12 DIAGNOSIS — R001 Bradycardia, unspecified: Secondary | ICD-10-CM

## 2011-01-12 DIAGNOSIS — I6350 Cerebral infarction due to unspecified occlusion or stenosis of unspecified cerebral artery: Secondary | ICD-10-CM

## 2011-01-12 DIAGNOSIS — I498 Other specified cardiac arrhythmias: Secondary | ICD-10-CM

## 2011-01-12 DIAGNOSIS — I4891 Unspecified atrial fibrillation: Secondary | ICD-10-CM

## 2011-01-12 MED ORDER — HYDRALAZINE HCL 25 MG PO TABS: ORAL_TABLET | ORAL | Status: DC

## 2011-01-12 NOTE — Patient Instructions (Addendum)
Your physician recommends that you schedule a follow-up appointment with Tereso Newcomer PA in 4 weeks  Your physician wants you to follow-up in: 4 months You will receive a reminder letter in the mail two months in advance. If you don't receive a letter, please call our office to schedule the follow-up appointment.  START HYDRALAZINE 25 mg three times a day

## 2011-01-13 DIAGNOSIS — R001 Bradycardia, unspecified: Secondary | ICD-10-CM | POA: Insufficient documentation

## 2011-01-13 NOTE — Assessment & Plan Note (Signed)
Given history of stroke, should be bridged with heparin or Lovenox if she has to come off warfarin for a procedure.

## 2011-01-13 NOTE — Progress Notes (Signed)
PCP: Dr. Cato Mulligan  77 yo with history of paroxysmal atrial fibrillation, bradycardia, and CVA presents for cardiology followup.  She was admitted in 11/12 with atrial fibrillation with rapid response.  She ended up getting a TEE-guided cardioversion.  She has done well since getting back home.  She feels like her heart has stayed in rhythm; no tachypalpitations.  She is walking in her house and around the block without exertional dyspnea.  No chest pain.  No falls or unsteadiness.  She is on warfarin. BP continues to run rather high, up to the 170s systolic at home.   ECG: NSR, PVCs, nonspecific T wave flattening.   Labs (11/12): K 3.9, creatinine 0.91, HCT 40.7  PMH: 1. HTN 2. H/o CVA 3. Paroxysmal atrial fibrillation: Has been intolerant to flecainide. 4. Symptomatic bradycardia: Related to being on multiple nodal blockers at once.  5. Memory difficulty 6. Angioedema with ACEI 7. H/o atrial tachycardia in 3/12 with syncope.  8. Echo (11/12): EF 55-60%, mild AI, mild AS, mild MR, normal RV size and systolic function.  9. Carotid bruit: Carotid dopplers 11/12 with plaque but no significant stenosis.   SH: Nonsmoker.  Lives in [place] with her son.  No ETOH.   FH: No premature CAD  ROS: All systems reviewed and negative except as per HPI.   Current Outpatient Prescriptions  Medication Sig Dispense Refill  . diltiazem (CARDIZEM CD) 180 MG 24 hr capsule Take 1 capsule (180 mg total) by mouth daily.  30 capsule  6  . LORazepam (ATIVAN) 0.5 MG tablet Take 0.5 mg by mouth as needed. For anxiety      . Multiple Vitamin (MULTIVITAMIN) tablet Take 1 tablet by mouth daily.        Marland Kitchen olmesartan (BENICAR) 20 MG tablet Take 1 tablet (20 mg total) by mouth daily.  90 tablet  6  . warfarin (COUMADIN) 4 MG tablet Take 4 mg by mouth as directed.        . hydrALAZINE (APRESOLINE) 25 MG tablet Take 25 mg three times a day  120 tablet  11    BP 144/55  Pulse 60  Ht 5\' 4"  (1.626 m)  Wt 55.339 kg  (122 lb)  BMI 20.94 kg/m2 General: NAD Neck: No JVD, no thyromegaly or thyroid nodule.  Lungs: Clear to auscultation bilaterally with normal respiratory effort. CV: Nondisplaced PMI.  Heart regular S1/S2, no S3/S4, 2/6 early SEM.  No peripheral edema.  Right carotid bruit.  Normal pedal pulses.  Abdomen: Soft, nontender, no hepatosplenomegaly, no distention.  Neurologic: Alert and oriented x 3.  Psych: Normal affect. Extremities: No clubbing or cyanosis.

## 2011-01-13 NOTE — Assessment & Plan Note (Signed)
Paroxysmal.  Also has had runs of atrial tachycardia.  She seems to have remained in NSR since DCCV in 11/12.  Continue diltiazem and warfarin.  If she has recurrent symptomatic atrial fibrillation, will probably have her use amiodarone (intolerant to flecainide).

## 2011-01-13 NOTE — Assessment & Plan Note (Signed)
BP runs rather high at times.  I do not want to start beta blocker or increase diltiazem given history of sympomatic bradycardia.  She is taking olmesartan 20. As she has a history of angioedema with ACEI and there is a small risk of the same with ARBs, I do not want to increase olmesartan.  I will instead have her take hydralazine 25 mg tid.

## 2011-01-13 NOTE — Assessment & Plan Note (Signed)
Symptomatic, related to use of multiple nodal blockers.

## 2011-01-15 ENCOUNTER — Telehealth: Payer: Self-pay | Admitting: Internal Medicine

## 2011-01-15 NOTE — Telephone Encounter (Signed)
Pt is currently getting her INR at [facility] and would like to switch here because it is closer for pt. Can pt start getting it donehere?

## 2011-01-17 NOTE — Telephone Encounter (Signed)
Okay 

## 2011-01-18 ENCOUNTER — Ambulatory Visit (INDEPENDENT_AMBULATORY_CARE_PROVIDER_SITE_OTHER): Payer: Medicare Other | Admitting: *Deleted

## 2011-01-18 DIAGNOSIS — Z7901 Long term (current) use of anticoagulants: Secondary | ICD-10-CM

## 2011-01-18 DIAGNOSIS — I4891 Unspecified atrial fibrillation: Secondary | ICD-10-CM

## 2011-01-18 NOTE — Telephone Encounter (Signed)
L/m on son's voicemail to call back and schedule appt for INR

## 2011-01-19 ENCOUNTER — Telehealth: Payer: Self-pay | Admitting: Cardiology

## 2011-01-19 NOTE — Telephone Encounter (Signed)
Spoke with pts son and advised to reduce hydralizine to bid or daily to help alleviate dizziness.  If not better in 2-3 days, will call back.

## 2011-01-19 NOTE — Telephone Encounter (Signed)
New message:  The new BP medication is causing her to be dizzy when she stands up.  They would like to discontinue the new one that she was put on.  Please call and discuss.

## 2011-01-22 NOTE — Telephone Encounter (Signed)
Patient's son wanted to let Dr. Shirlee Latch that patient continues to have dizziness after decreasing the hydralazine medication to twice a day. Patient does not want to continue taken the Hydralazine and she will go back to take Benicar prescribed by Dr. Cato Mulligan her PCP. Patient would like to cancelled the appointment with Tereso Newcomer PA. because son said she is not going to  Continue taken the Hydralazine. Pt. Will  see Dr. Timoteo Gaul in the mean time. She will call back to make an appointment to see Dr. Shirlee Latch in 4 months as planned on her last MD office visit.

## 2011-01-22 NOTE — Telephone Encounter (Signed)
N/A.  LMTC. 

## 2011-01-22 NOTE — Telephone Encounter (Signed)
Fu call  Pt's son is returning your call

## 2011-01-22 NOTE — Telephone Encounter (Signed)
F/u:  Pt is still dizzy and does not want to take the Hydralizine at all.  Son states that this is for her quality of life.  They will continue the Benicar. Do they need to keep the follow up appt with Dr. Shirlee Latch?

## 2011-01-28 ENCOUNTER — Ambulatory Visit (INDEPENDENT_AMBULATORY_CARE_PROVIDER_SITE_OTHER): Payer: Medicare Other

## 2011-01-28 DIAGNOSIS — I4891 Unspecified atrial fibrillation: Secondary | ICD-10-CM

## 2011-01-28 DIAGNOSIS — Z7901 Long term (current) use of anticoagulants: Secondary | ICD-10-CM

## 2011-01-28 NOTE — Patient Instructions (Signed)
  Latest dosing instructions   Total Glynis Smiles Tue Wed Thu Fri Sat   16 2 mg 4 mg 2 mg 2 mg 2 mg 2 mg 2 mg    (4 mg0.5) (4 mg1) (4 mg0.5) (4 mg0.5) (4 mg0.5) (4 mg0.5) (4 mg0.5)

## 2011-02-10 ENCOUNTER — Ambulatory Visit: Payer: Medicare Other | Admitting: Physician Assistant

## 2011-02-10 ENCOUNTER — Telehealth: Payer: Self-pay | Admitting: *Deleted

## 2011-02-10 NOTE — Telephone Encounter (Signed)
That would be ok.

## 2011-02-10 NOTE — Telephone Encounter (Signed)
Is it ok to switch to [place] XT 180mg ?

## 2011-02-10 NOTE — Telephone Encounter (Signed)
Received a call from Dianna at the [facility] pharmacy. She received a transfer from Karin Golden for the pt's diltiazem. She states the pt was previously on diltiazem CD 180 mg. They would like to switch this to Cartia XT 180 mg if possible.   Dianna called our [facility] office because pt is established there. She was told by the staff in the [facility] office to call the [place] office because the prescription was written by Gene Serpe, PA-C. I am assuming it was written by Gene during a hospitalization, as pt is not seen in the [place] office.   Dianna is aware that message will be sent to [facility] office to be reviewed by her primary cardiologist.

## 2011-02-12 NOTE — Telephone Encounter (Signed)
LM on [facility] pharmacy voice mail that it is OK to change from Diltiazem CD 180mg  to Cartia XT 180 mg

## 2011-02-16 ENCOUNTER — Telehealth: Payer: Self-pay | Admitting: Cardiology

## 2011-02-16 ENCOUNTER — Ambulatory Visit: Payer: Medicare Other

## 2011-02-16 NOTE — Telephone Encounter (Signed)
LMTCB

## 2011-02-16 NOTE — Telephone Encounter (Signed)
New Msg: Pt son calling with sickness claim form that needs to be completed and mailed to [employer]. Pt son is mailing form to our office. Please be on the lookout. Please return pt son call to discuss what son should include in the letter sending to our office. Please return pt son call to discuss further.

## 2011-02-17 ENCOUNTER — Telehealth: Payer: Self-pay | Admitting: Cardiology

## 2011-02-17 NOTE — Telephone Encounter (Signed)
Patient's son would like to know if Dr. Shirlee Latch got the insurance papers faxed yesterday. I was unable to find papers, pt's son will fax them back today.

## 2011-02-17 NOTE — Telephone Encounter (Deleted)
Pt's husband calling re fax on pt that was sent yesterday, was it received

## 2011-02-17 NOTE — Telephone Encounter (Signed)
Pt's son calling back re status of form he faxed over yesterday, requesting call

## 2011-02-17 NOTE — Telephone Encounter (Signed)
Insurance papers from [employer] received and placed on  [employer]. Patient's son aware.

## 2011-02-18 NOTE — Telephone Encounter (Signed)
Per Almira Coaster in HIM--[employer] completes these forms. Form to HIM to be forwarded to [facility] for completion.

## 2011-02-19 NOTE — Telephone Encounter (Signed)
Fu call Pt calling back about this please call back

## 2011-02-19 NOTE — Telephone Encounter (Signed)
Pt informed that we will notify him when forms are complete.

## 2011-02-22 ENCOUNTER — Ambulatory Visit (INDEPENDENT_AMBULATORY_CARE_PROVIDER_SITE_OTHER): Payer: Medicare Other | Admitting: Internal Medicine

## 2011-02-22 DIAGNOSIS — I6350 Cerebral infarction due to unspecified occlusion or stenosis of unspecified cerebral artery: Secondary | ICD-10-CM

## 2011-02-22 DIAGNOSIS — I10 Essential (primary) hypertension: Secondary | ICD-10-CM

## 2011-02-22 DIAGNOSIS — I4891 Unspecified atrial fibrillation: Secondary | ICD-10-CM

## 2011-02-22 DIAGNOSIS — Z7901 Long term (current) use of anticoagulants: Secondary | ICD-10-CM

## 2011-02-22 DIAGNOSIS — T783XXA Angioneurotic edema, initial encounter: Secondary | ICD-10-CM

## 2011-02-22 MED ORDER — OLMESARTAN MEDOXOMIL 40 MG PO TABS: 40.0000 mg | ORAL_TABLET | Freq: Every day | ORAL | Status: AC

## 2011-02-22 MED ORDER — OLMESARTAN MEDOXOMIL 40 MG PO TABS: 40.0000 mg | ORAL_TABLET | Freq: Every day | ORAL | Status: DC

## 2011-02-22 NOTE — Progress Notes (Signed)
Patient ID: [name] [name], female   DOB: 1923-05-14, 78 y.o.   MRN: [id number] Husband recently deceased, now living with son and family  htn---toleating meds except unable to take hydralazine. Says home bps are 160s/60s however when she stands quickly she will get lightheaded.   Chronic anticoagulation for AFIB---tolerating warfarin.   Memory: doing reasonably well  Past Medical History  Diagnosis Date  . Hypertension   . Osteopenia   . Cerebrovascular disease   . Angioedema     2/2 ACE inhibitors  . Arthritis   . Stroke   . Mild memory disturbances not amounting to dementia   . Atrial tachycardia     a.  eval for syncope 3/12 with event monitor that demonstrated WCT felt to likely be ATach with aberrancy and Fleacainide started in 4/12;   b.  echo 05/08/10: EF 55-60%, mod LVH, trivial AS (AVA 1.4, mean gradient 24), trivial AI, mod MAC with mean gradient 10 mmHg, mild to mod LAE and mild RVH    History   Social History  . Marital Status: Widowed    Spouse Name: N/A    Number of Children: N/A  . Years of Education: N/A   Occupational History  . Retired    Social History Main Topics  . Smoking status: Former Games developer  . Smokeless tobacco: Not on file  . Alcohol Use: No  . Drug Use: No  . Sexually Active: No   Other Topics Concern  . Not on file   Social History Narrative  . No narrative on file    Past Surgical History  Procedure Date  . Abdominal hysterectomy 1963    unilateral oophorectomy  . Bunionectomy 1986  . Dilation and curettage of uterus 1962  . Tee without cardioversion 12/17/2010    Procedure: TRANSESOPHAGEAL ECHOCARDIOGRAM (TEE);  Surgeon: Lewayne Bunting, MD;  Location: [facility] ENDOSCOPY;  Service: Cardiovascular;  Laterality: N/A;  . Cardioversion 12/17/2010    Procedure: CARDIOVERSION;  Surgeon: Lewayne Bunting, MD;  Location: [facility] ENDOSCOPY;  Service: Cardiovascular;  Laterality: N/A;    Family History  Problem Relation Age of Onset  . Heart  disease Mother   . Heart disease Father   . Colon cancer Neg Hx     Allergies  Allergen Reactions  . Ace Inhibitors     REACTION: angioedema  . Famotidine     REACTION: tongue swelling  . Felodipine     REACTION: ? angioedma after taking for 26 days---UNCLEAR  . Fexofenadine     REACTION: knees buckled  . Flecainide Nausea Only  . Lisinopril     REACTION: itching, rash  . Metoprolol Tartrate     REACTION: vertigo  . Penicillins     REACTION: rash  . Pneumococcal Vaccine Polyvalent     REACTION: unspecified    Current Outpatient Prescriptions on File Prior to Visit  Medication Sig Dispense Refill  . diltiazem (CARDIZEM CD) 180 MG 24 hr capsule Take 1 capsule (180 mg total) by mouth daily.  30 capsule  6  . LORazepam (ATIVAN) 0.5 MG tablet Take 0.5 mg by mouth as needed. For anxiety      . Multiple Vitamin (MULTIVITAMIN) tablet Take 1 tablet by mouth daily.        Marland Kitchen olmesartan (BENICAR) 20 MG tablet Take 1 tablet (20 mg total) by mouth daily.  90 tablet  6  . warfarin (COUMADIN) 4 MG tablet Take 4 mg by mouth. 4 mg on Monday and 2 mg all  others         patient denies chest pain, shortness of breath, orthopnea. Denies lower extremity edema, abdominal pain, change in appetite, change in bowel movements. Patient denies rashes, musculoskeletal complaints. No other specific complaints in a complete review of systems.   BP 148/75  Pulse 72  Temp(Src) 98 F (36.7 C) (Oral)  Wt 124 lb (56.246 kg)  Well-developed well-nourished female in no acute distress. HEENT exam atraumatic, normocephalic, extraocular muscles are intact. Neck is supple. No jugular venous distention no thyromegaly. Chest clear to auscultation without increased work of breathing. Cardiac exam S1 and S2 are regular. Abdominal exam active bowel sounds, soft, nontender. Extremities no edema.

## 2011-02-23 NOTE — Patient Instructions (Signed)
  Latest dosing instructions   Total Glynis Smiles Tue Wed Thu Fri Sat   18 2 mg 4 mg 2 mg 2 mg 4 mg 2 mg 2 mg    (4 mg0.5) (4 mg1) (4 mg0.5) (4 mg0.5) (4 mg1) (4 mg0.5) (4 mg0.5)

## 2011-02-24 NOTE — Assessment & Plan Note (Signed)
Fair control Continue current meds 

## 2011-02-24 NOTE — Assessment & Plan Note (Signed)
No recurrence She is now living with son. I think this is the safest decision for her.  Risk factor modification

## 2011-02-24 NOTE — Assessment & Plan Note (Signed)
No recurrence. 

## 2011-02-26 ENCOUNTER — Telehealth: Payer: Self-pay | Admitting: Cardiology

## 2011-02-26 NOTE — Telephone Encounter (Signed)
New Problem  Patient son [name] [name] requesting a return call regarding status of medical documents. He can be can @ [phone]

## 2011-03-01 NOTE — Telephone Encounter (Signed)
  [name], [name] - RE: form that was sent to [employer] More Detail >>      RE: form that was sent to [facility] [name]        Sent: Mon March 01, 2011 11:01 AM    To: Jacqlyn Krauss, RN   03/01/11 talked with pt's son,Bill. He received packet in the mail Friday  for medical release and other info. He will call Elease Hashimoto at [facility]  [phone] to get further information about completing and returning the form.      [name] [name]    MRN: [id number] DOB: [birth year]     Pt Home: [phone]               Message     Yes. Sorry Have Him Call Elease Hashimoto @ [employer] @ [phone] ----- Message -----    From: Jacqlyn Krauss, RN    Sent: 03/01/2011  10:55 AM      To: [name] [name] Subject: RE: form that was sent to [employer]           Is there a number I can give her son to call? Thanks. Anne ----- Message -----    From: [name] [name]    Sent: 03/01/2011  10:16 AM      To: Jacqlyn Krauss, RN Subject: RE: form that was sent to [employer]           [employer] is Waiting On Release & payment Packet before anything can be done ----- Message -----    From: Jacqlyn Krauss, RN    Sent: 03/01/2011  10:09 AM      To: [name] [name] Subject: RE: form that was sent to [employer]           Do you know what I should tell her son asking about the form? Anne ----- Message -----    From: [name] [name]    Sent: 03/01/2011   8:11 AM      To: Jacqlyn Krauss, RN Subject: RE: form that was sent to [employer]           This was A Physicians Statement from [employer].Marland Kitchen[facility] sent pt a Public librarian & Release to Sign, THis Foms looks Like it Still with [employer]   Kim ----- Message -----    From: Jacqlyn Krauss, RN    Sent: 03/01/2011   7:15 AM      To: Katheran James Flowers, [name] [name] Subject: form that was sent to [employer]               Do you know where the form is that was sent to [employer] to  complete ? He so is looking for the form. I do not have it. Thanks. Thurston Hole

## 2011-03-05 ENCOUNTER — Telehealth: Payer: Self-pay | Admitting: Cardiology

## 2011-03-05 NOTE — Telephone Encounter (Signed)
Attending Physician Statement faxed to [facility]  [facility]

## 2011-03-05 NOTE — Telephone Encounter (Signed)
New Problem   Patient son [name] [name] has called 4 times regarding physician docs for patient, please return to [name] [phone]

## 2011-03-05 NOTE — Telephone Encounter (Signed)
Dr Shirlee Latch signed form today. The completed form was returned to HIM. I transferred pt's son to HIM to discuss getting paperwork.

## 2011-03-12 ENCOUNTER — Other Ambulatory Visit: Payer: Self-pay | Admitting: *Deleted

## 2011-03-12 ENCOUNTER — Telehealth: Payer: Self-pay | Admitting: *Deleted

## 2011-03-12 ENCOUNTER — Telehealth: Payer: Self-pay | Admitting: Family Medicine

## 2011-03-12 MED ORDER — OLMESARTAN MEDOXOMIL 20 MG PO TABS: 20.0000 mg | ORAL_TABLET | Freq: Every day | ORAL | Status: DC

## 2011-03-12 NOTE — Telephone Encounter (Signed)
Please call son at [phone]. This is about his mom's Benicar dosage.

## 2011-03-12 NOTE — Telephone Encounter (Signed)
Pt's son wanted Dr. Cato Mulligan to know that the family lowered pt's Benicar from 40mg  to 20 mg, and her BP did not change.  The 40 mg was making her have severe diarrhea.

## 2011-03-12 NOTE — Telephone Encounter (Signed)
Update med list

## 2011-03-12 NOTE — Telephone Encounter (Signed)
Chart is giving warning for Benicar as an allergy.

## 2011-03-12 NOTE — Telephone Encounter (Signed)
Ok to give Allergy to ACE-I not ARB

## 2011-04-05 ENCOUNTER — Ambulatory Visit: Payer: PRIVATE HEALTH INSURANCE | Admitting: Internal Medicine

## 2011-04-25 IMAGING — CT CT HEAD W/O CM
1 series · 15 of 30 positions shown, 19 images · non-contrast
Comparison: None

CLINICAL DATA: Fell and hit head.  Memory loss and slurred speech.

CT HEAD WITHOUT CONTRAST
TECHNIQUE: Contiguous axial images were obtained from the base of
the skull through the vertex without contrast.

[Series 2: head_seq 4.5 h37s st · axial · 0.38mm/px · z∈[+1361,+1487]mm · 15 of 32 slices shown, 19 images]
[im 2/32  brain]
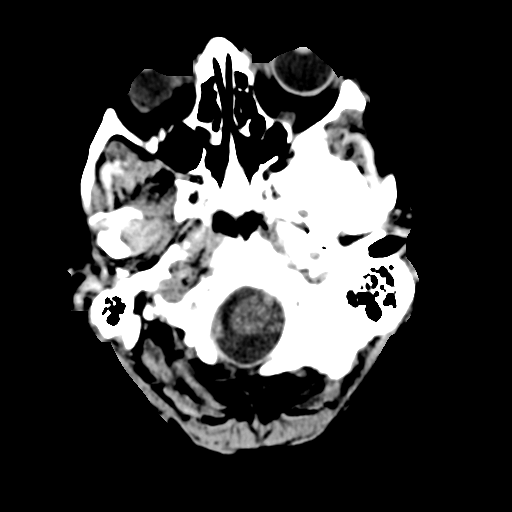
[im 2/32  bone]
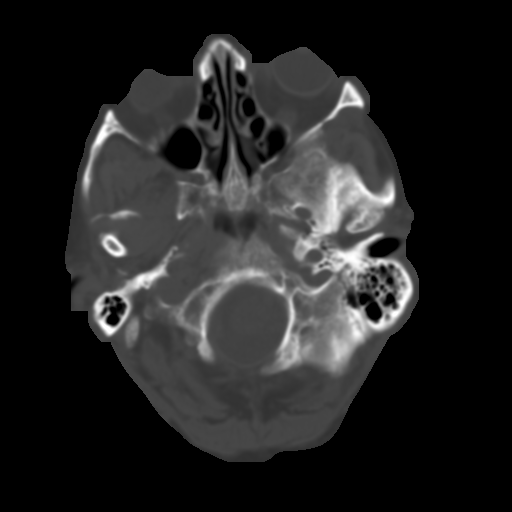
[im 4/32  brain]
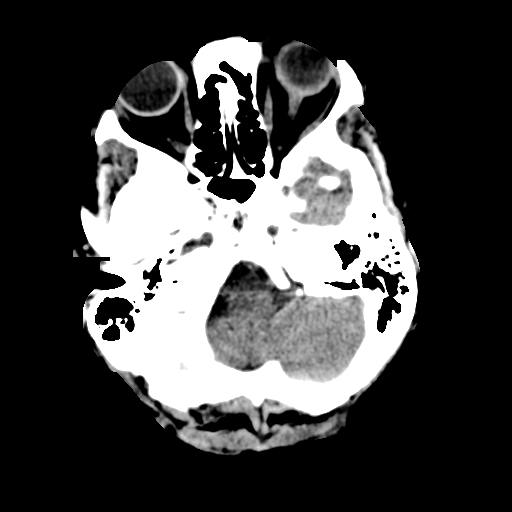
[im 6/32  brain]
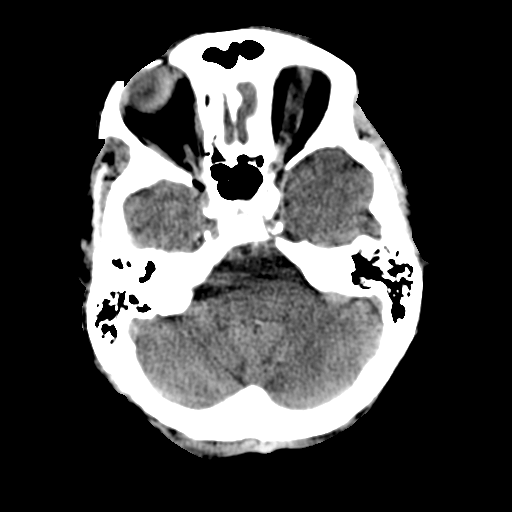
[im 8/32  brain]
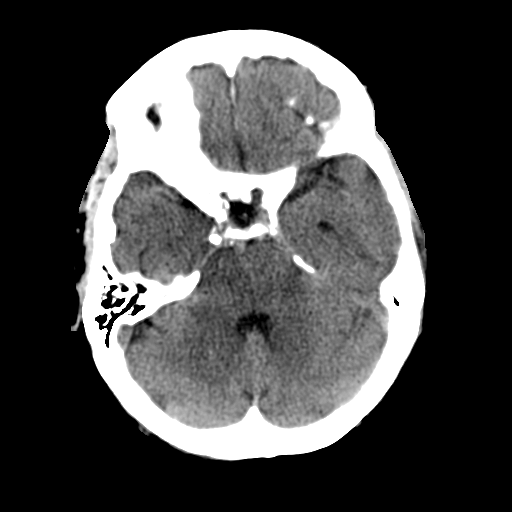
[im 10/32  brain]
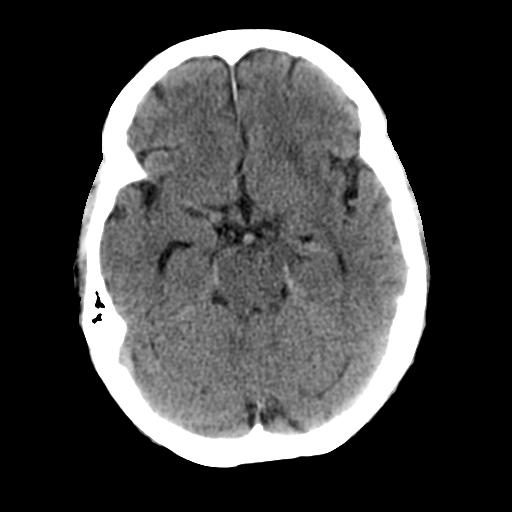
[im 10/32  bone]
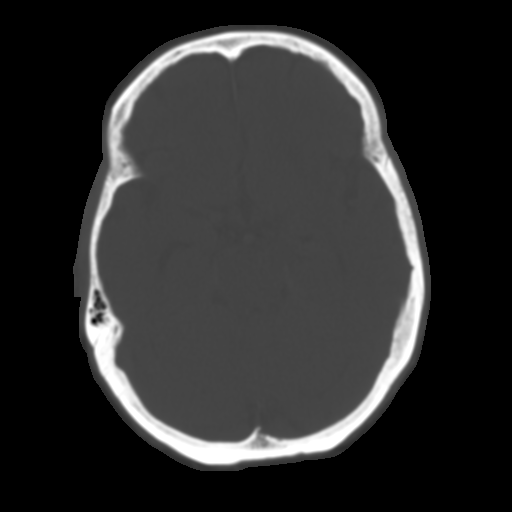
[im 12/32  brain]
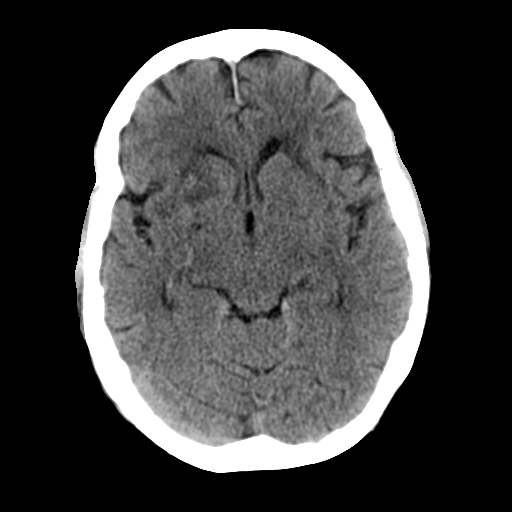
[im 14/32  brain]
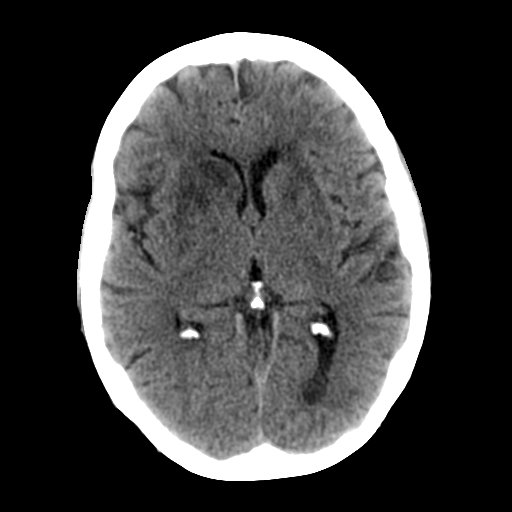
[im 17/32  brain]
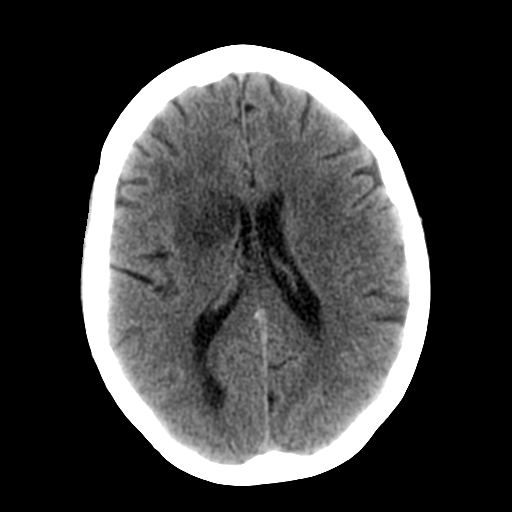
[im 18/32  brain]
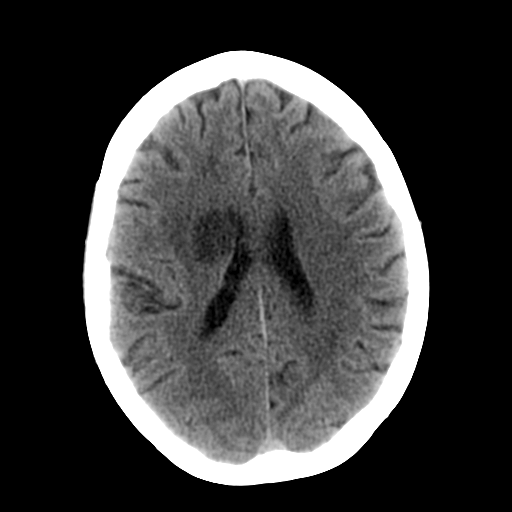
[im 18/32  bone]
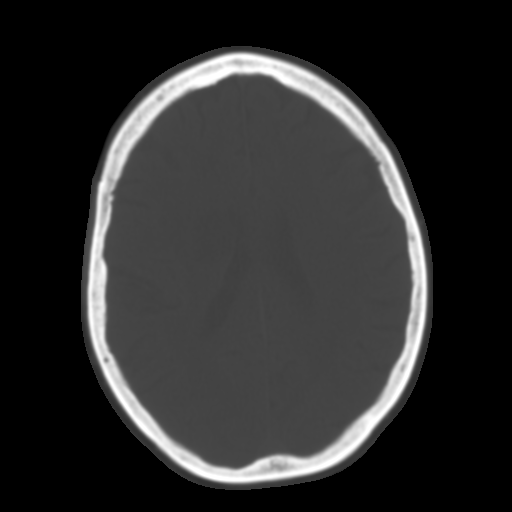
[im 20/32  brain]
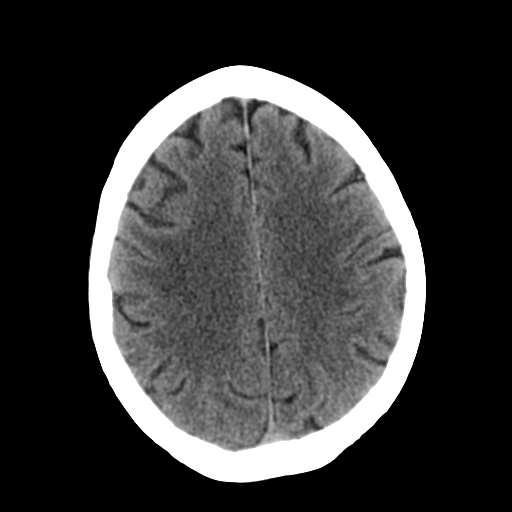
[im 22/32  brain]
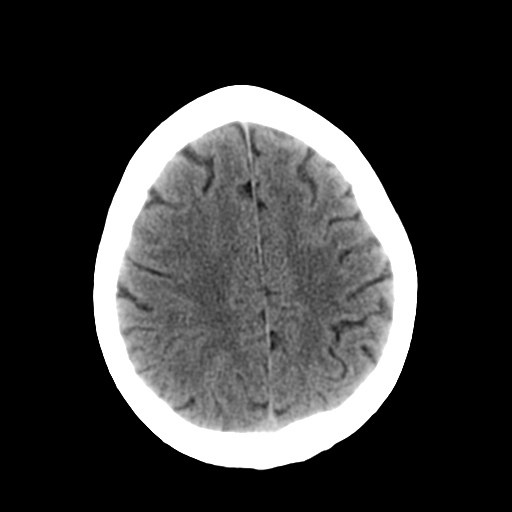
[im 24/32  brain]
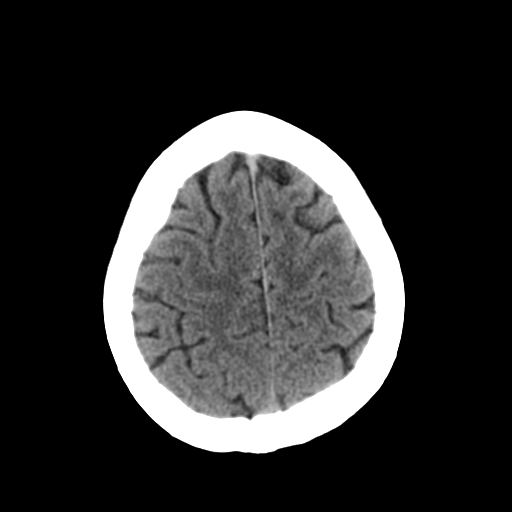
[im 26/32  brain]
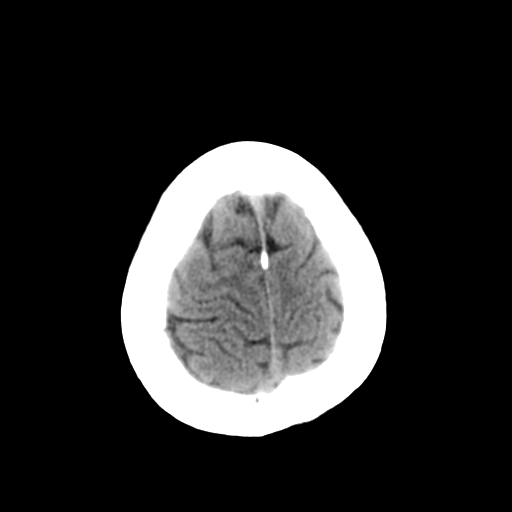
[im 26/32  bone]
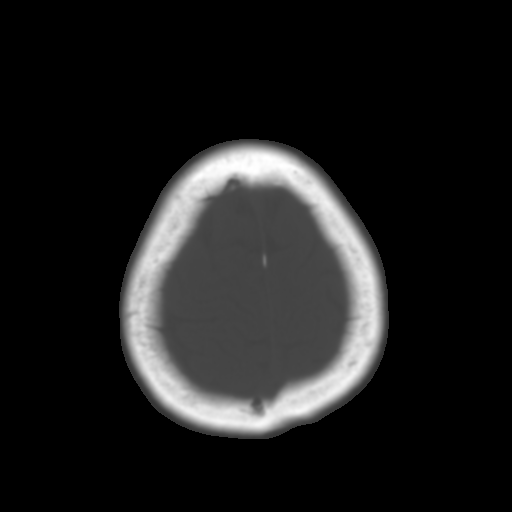
[im 28/32  brain]
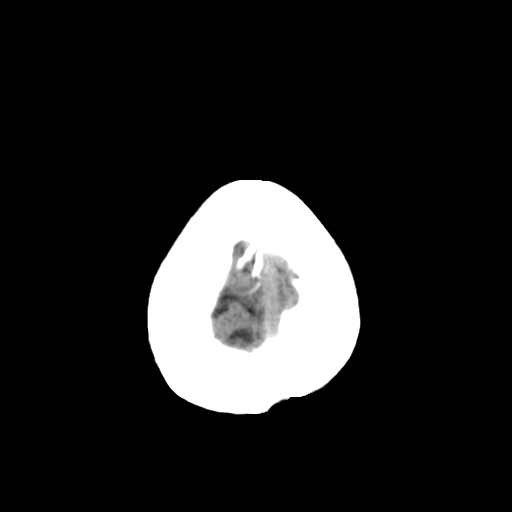
[im 30/32  brain]
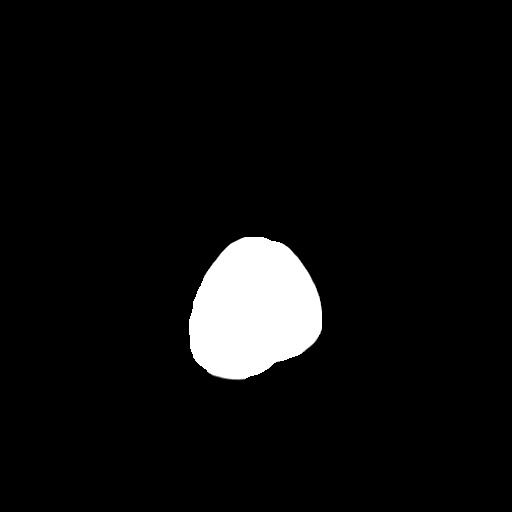

[15 of 30 positions shown; findings below may reference images not displayed]

FINDINGS: Negative for intracranial hemorrhage.  Ventricle size is
normal.

There is an ill-defined low density in the head of the caudate on
the right which has the appearance of acute infarction.  There is
also low density extending into the anterior limb internal capsule
and putamen on the right.  These areas are also compatible with
acute infarct.

Negative for acute hemorrhage. There is no midline shift.  There is
mild mass effect on the right frontal horn due to edema in the
infarct.  Chronic microvascular ischemia is present in the white
matter bilaterally.  Chronic ischemia is present in the internal
capsule on the left.

The calvarium is intact.
IMPRESSION: Low density area in the right anterior basal ganglia, compatible
with acute / subacute infarct.

 Negative for hemorrhage.

Chronic microvascular ischemia is present.

Critical test results telephoned to Dr. Royal at the time of
interpretation on 04/02/2009 at eleventh 30 hours.

## 2011-05-26 ENCOUNTER — Ambulatory Visit: Payer: Self-pay | Admitting: Cardiology

## 2011-05-26 DIAGNOSIS — I4891 Unspecified atrial fibrillation: Secondary | ICD-10-CM

## 2011-05-26 DIAGNOSIS — Z7901 Long term (current) use of anticoagulants: Secondary | ICD-10-CM

## 2011-06-01 ENCOUNTER — Telehealth: Payer: Self-pay | Admitting: Cardiology

## 2011-06-01 NOTE — Telephone Encounter (Signed)
New Problem:    I called the home phone number [phone]) listed for the patient, which is the only number listed for the patient, and received a message that the number had been disconnected.  I called the patient's son and learned that after her husband passed away 10-31-2024of last year she moved in with him.  Patient's son has arranged for the patient to see new physicians in [facility] and will no longer have her come back to our practice.  4/30/13KJ

## 2011-07-13 ENCOUNTER — Other Ambulatory Visit: Payer: Self-pay | Admitting: Physician Assistant

## 2011-07-13 NOTE — Telephone Encounter (Signed)
[address] patient 

## 2011-08-22 ENCOUNTER — Other Ambulatory Visit: Payer: Self-pay | Admitting: Cardiology

## 2011-08-23 ENCOUNTER — Other Ambulatory Visit: Payer: Self-pay | Admitting: *Deleted

## 2011-08-23 MED ORDER — DILTIAZEM HCL ER COATED BEADS 180 MG PO CP24: 180.0000 mg | ORAL_CAPSULE | Freq: Every day | ORAL | Status: DC

## 2011-08-23 NOTE — Telephone Encounter (Signed)
Refilled cartia 

## 2011-10-02 ENCOUNTER — Other Ambulatory Visit: Payer: Self-pay | Admitting: Internal Medicine

## 2012-02-12 ENCOUNTER — Other Ambulatory Visit: Payer: Self-pay | Admitting: Cardiology

## 2012-05-22 IMAGING — CR DG CHEST 2V
2 series · 2 of 2 positions shown · non-contrast
Comparison: None.

CLINICAL DATA: Dizziness

CHEST - 2 VIEW

[w chest pa]
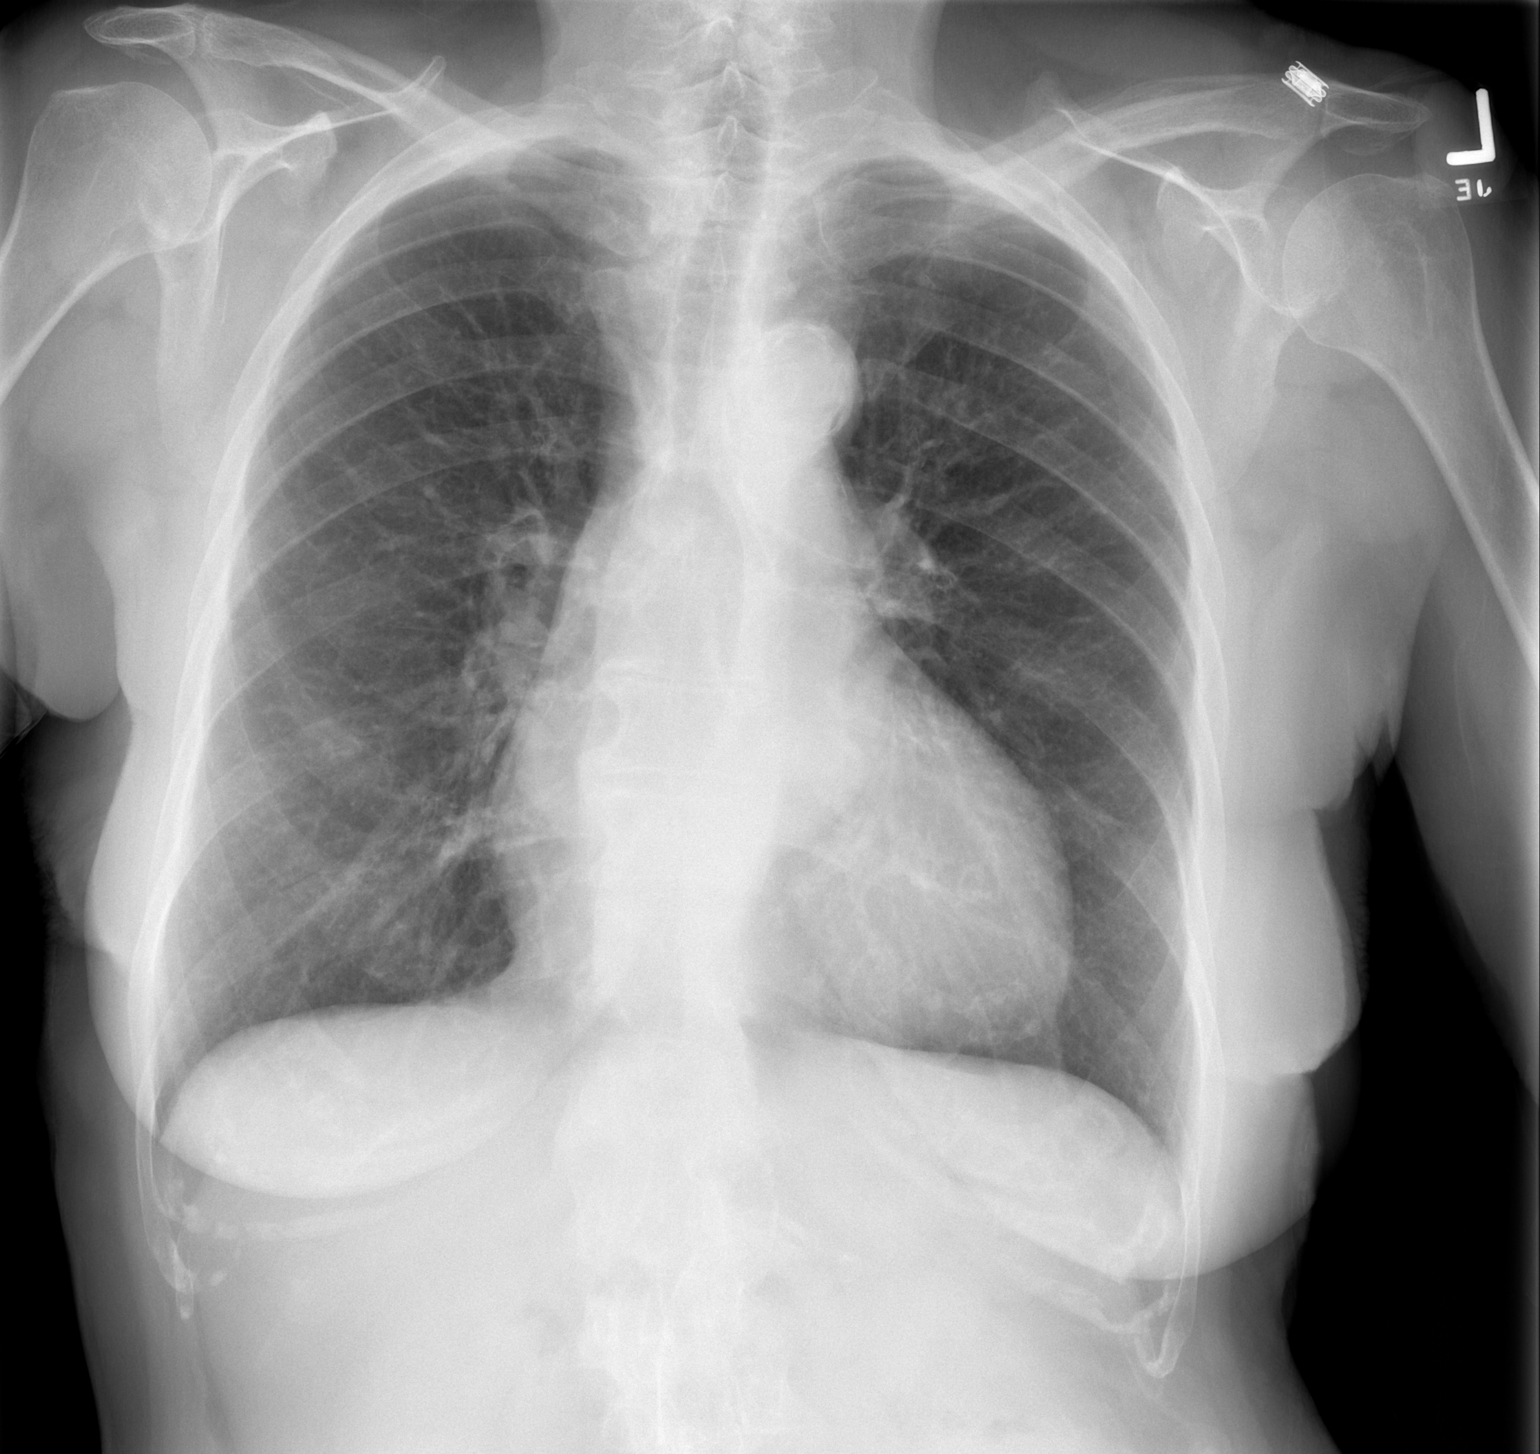

[w chest lat]
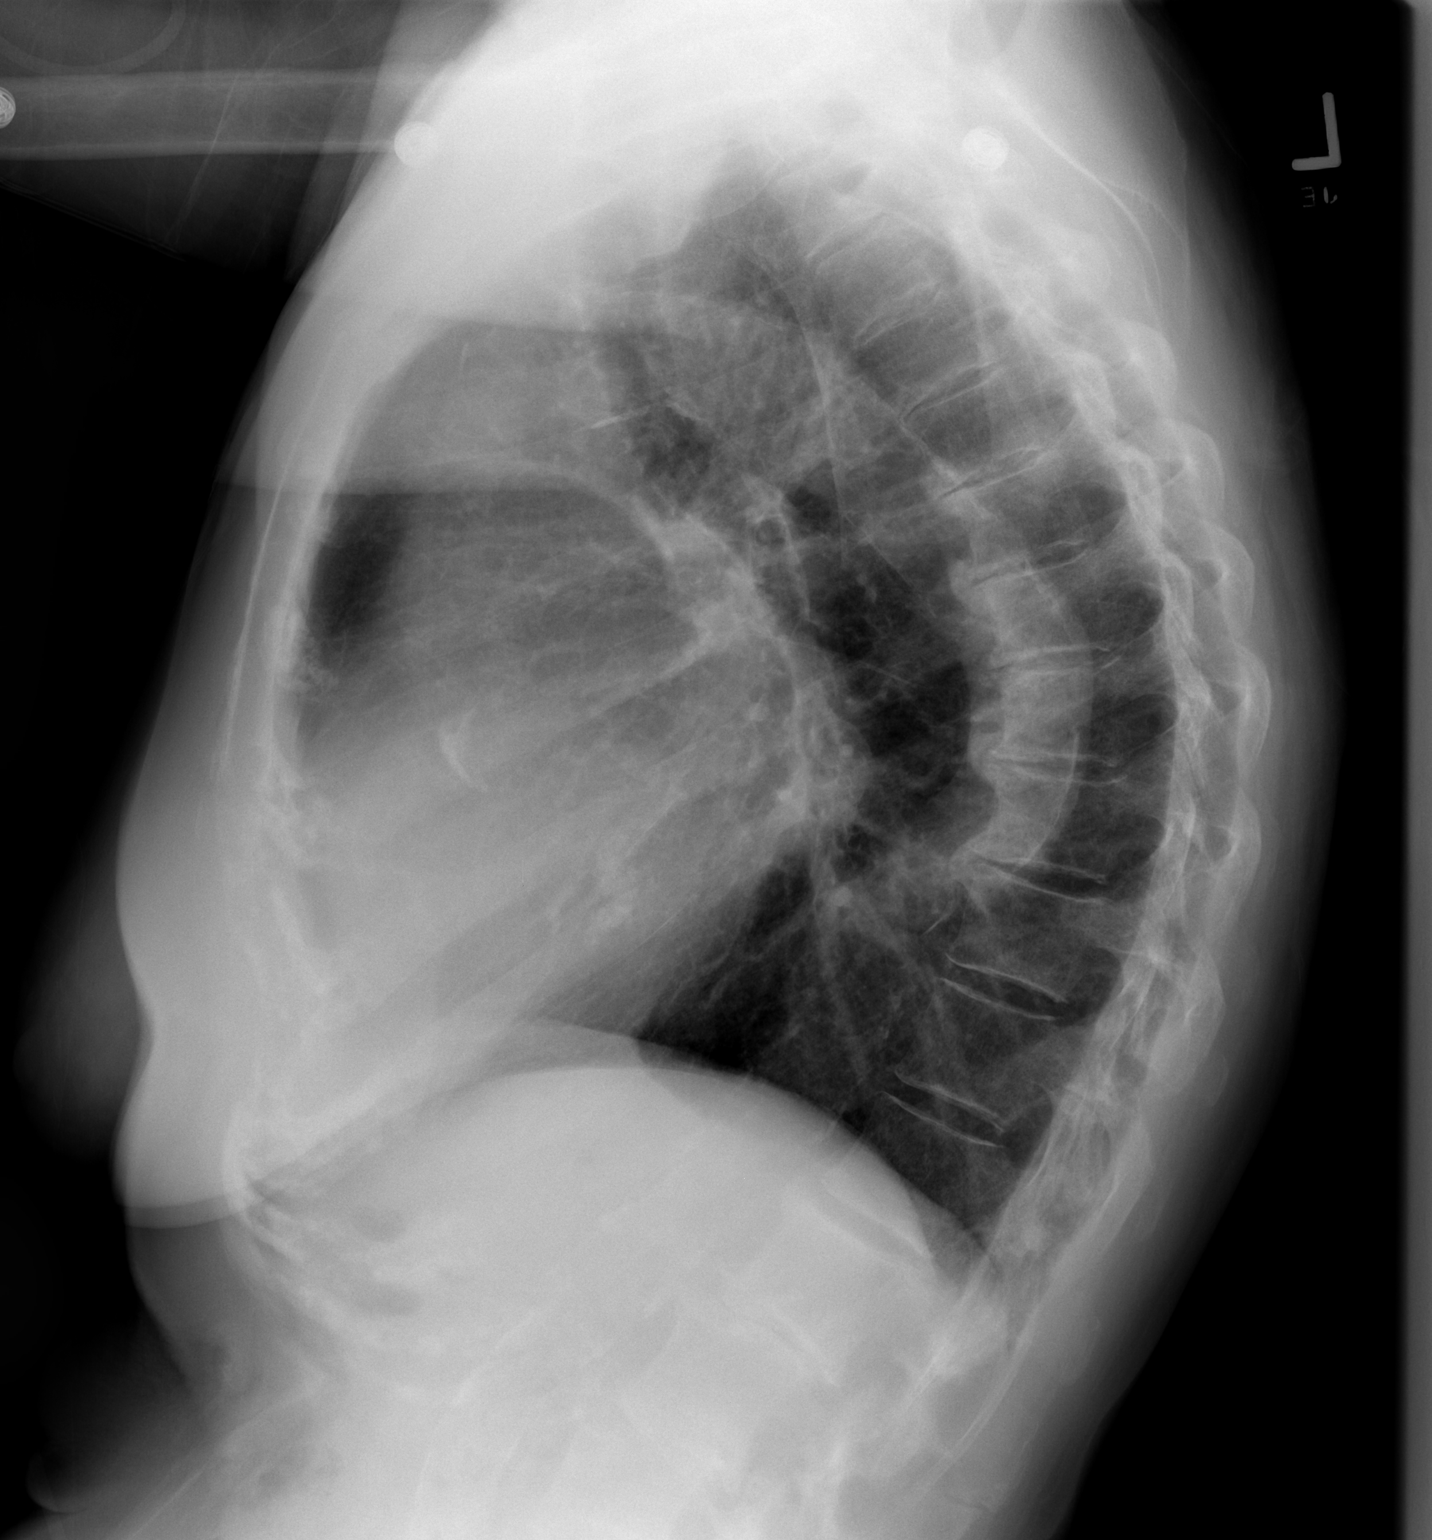

[2 of 2 positions shown; findings below may reference images not displayed]

FINDINGS: Mild cardiomegaly.  Pulmonary vascularity is within
normal limits.  Lungs are hyperaerated.  No pneumothorax or pleural
effusion.
IMPRESSION: Cardiomegaly without edema.

## 2013-08-16 NOTE — Telephone Encounter (Signed)
Close Encounter 

## 2014-02-14 ENCOUNTER — Encounter (HOSPITAL_COMMUNITY): Payer: Self-pay | Admitting: Cardiology

## 2014-08-12 ENCOUNTER — Encounter: Payer: Self-pay | Admitting: Gastroenterology

## 2019-12-03 DEATH — deceased
